# Patient Record
Sex: Male | Born: 1995 | Race: White | Hispanic: No | Marital: Single | Smoking: Never smoker
Health system: Southern US, Community
[De-identification: ages and names within clinical notes are randomized; demographics above are authoritative.]

## PROBLEM LIST (undated history)

## (undated) DIAGNOSIS — L709 Acne, unspecified: Secondary | ICD-10-CM

## (undated) HISTORY — PX: TYMPANOSTOMY TUBE PLACEMENT: SHX32

## (undated) HISTORY — DX: Acne, unspecified: L70.9

---
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
## 2015-05-21 DIAGNOSIS — L709 Acne, unspecified: Secondary | ICD-10-CM | POA: Insufficient documentation

## 2015-05-22 ENCOUNTER — Ambulatory Visit (INDEPENDENT_AMBULATORY_CARE_PROVIDER_SITE_OTHER): Payer: Managed Care, Other (non HMO) | Admitting: Unknown Physician Specialty

## 2015-05-22 ENCOUNTER — Encounter: Payer: Self-pay | Admitting: Unknown Physician Specialty

## 2015-05-22 VITALS — BP 133/86 | HR 85 | Temp 99.0°F | Ht 66.2 in | Wt 137.8 lb

## 2015-05-22 DIAGNOSIS — G44229 Chronic tension-type headache, not intractable: Secondary | ICD-10-CM | POA: Insufficient documentation

## 2015-05-22 MED ORDER — CYCLOBENZAPRINE HCL 10 MG PO TABS: 10.0000 mg | ORAL_TABLET | Freq: Every day | ORAL | Status: DC

## 2015-05-22 NOTE — Progress Notes (Signed)
BP 133/86 mmHg  Pulse 85  Temp(Src) 99 F (37.2 C)  Ht 5' 6.2" (1.681 m)  Wt 137 lb 12.8 oz (62.506 kg)  BMI 22.12 kg/m2  SpO2 98%   Subjective:    Patient ID: Willie Gilbert, male    DOB: 02/13/1995, 20 y.o.   MRN: [id number]  HPI: Willie Gilbert is a 20 y.o. male  Chief Complaint  Patient presents with  . Migraine    pt states he has been having severe headaches since January. He states he has been taking ibuprofen and tylenol PM. He also states his neck has been hurting before the headache starts.   Headaches Pt states he is getting frequent severe headaches.  It always starts at the back of his head and moves to temples and the pain is a pressure pain.  Sometimes nauseated but not typical.  States they start at random times of the day.  He doesn't usually wake up with them and often happen at the end of his shift and last through the night.  He is taking a lot of Ibuprofen and occasional Tylenol PM.  States the headaches are most days.    He works as a IT sales professional at [employer].  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  Denies any new stress.    Relevant past medical, surgical, family and social history reviewed and updated as indicated. Interim medical history since our last visit reviewed. Allergies and medications reviewed and updated.  Review of Systems  Constitutional: Negative.   Eyes: Negative.   Respiratory: Negative.   Cardiovascular: Negative.   Gastrointestinal: Negative.   Endocrine: Negative.   Genitourinary: Negative.   Skin: Negative.   Allergic/Immunologic: Negative.   Neurological: Positive for headaches.  Hematological: Negative.   Psychiatric/Behavioral: Negative.     Per HPI unless specifically indicated above     Objective:    BP 133/86 mmHg  Pulse 85  Temp(Src) 99 F (37.2 C)  Ht 5' 6.2" (1.681 m)  Wt 137 lb 12.8 oz (62.506 kg)  BMI 22.12 kg/m2  SpO2 98%  Wt Readings from Last 3 Encounters:   05/22/15 137 lb 12.8 oz (62.506 kg) (24 %*, Z = -0.72)  04/03/14 127 lb (57.607 kg) (14 %*, Z = -1.09)   * Growth percentiles are based on CDC 2-20 Years data.    Physical Exam  Constitutional: He is oriented to person, place, and time. He appears well-developed and well-nourished. No distress.  HENT:  Head: Normocephalic and atraumatic.  Eyes: Conjunctivae and lids are normal. Right eye exhibits no discharge. Left eye exhibits no discharge. No scleral icterus.  Neck: Normal range of motion. Spinous process tenderness and muscular tenderness present.  Cardiovascular: Normal rate.   Pulmonary/Chest: Effort normal.  Abdominal: Normal appearance. There is no splenomegaly or hepatomegaly.  Musculoskeletal: Normal range of motion.  Neurological: He is alert and oriented to person, place, and time.  Skin: Skin is intact. No rash noted. No pallor.  Psychiatric: He has a normal mood and affect. His behavior is normal. Judgment and thought content normal.    No results found for this or any previous visit.    Assessment & Plan:   Problem List Items Addressed This Visit      Unprioritized   Chronic tension headaches - Primary    Flexeril QHS prn.  Aleve in the AM before headache starts.  Appt with Dr. Laural Benes for manipulation to help  with neck pain.  Discussed yoga videos.        Relevant Medications   cyclobenzaprine (FLEXERIL) 10 MG tablet       Follow up plan: Return for appt with Dr. Laural BenesJohnson for body work.

## 2015-05-22 NOTE — Assessment & Plan Note (Signed)
Flexeril QHS prn.  Aleve in the AM before headache starts.  Appt with Dr. Laural BenesJohnson for manipulation to help with neck pain.  Discussed yoga videos.

## 2015-05-22 NOTE — Patient Instructions (Signed)
Tension Headache A tension headache is a feeling of pain, pressure, or aching that is often felt over the front and sides of the head. The pain can be dull, or it can feel tight (constricting). Tension headaches are not normally associated with nausea or vomiting, and they do not get worse with physical activity. Tension headaches can last from 30 minutes to several days. This is the most common type of headache. CAUSES The exact cause of this condition is not known. Tension headaches often begin after stress, anxiety, or depression. Other triggers may include:  Alcohol.  Too much caffeine, or caffeine withdrawal.  Respiratory infections, such as colds, flu, or sinus infections.  Dental problems or teeth clenching.  Fatigue.  Holding your head and neck in the same position for a long period of time, such as while using a computer.  Smoking. SYMPTOMS Symptoms of this condition include:  A feeling of pressure around the head.  Dull, aching head pain.  Pain felt over the front and sides of the head.  Tenderness in the muscles of the head, neck, and shoulders. DIAGNOSIS This condition may be diagnosed based on your symptoms and a physical exam. Tests may be done, such as a CT scan or an MRI of your head. These tests may be done if your symptoms are severe or unusual. TREATMENT This condition may be treated with lifestyle changes and medicines to help relieve symptoms. HOME CARE INSTRUCTIONS Managing Pain  Take over-the-counter and prescription medicines only as told by your health care provider.  Lie down in a dark, quiet room when you have a headache.  If directed, apply ice to the head and neck area:  Put ice in a plastic bag.  Place a towel between your skin and the bag.  Leave the ice on for 20 minutes, 2-3 times per day.  Use a heating pad or a hot shower to apply heat to the head and neck area as told by your health care provider. Eating and Drinking  Eat meals on  a regular schedule.  Limit alcohol use.  Decrease your caffeine intake, or stop using caffeine. General Instructions  Keep all follow-up visits as told by your health care provider. This is important.  Keep a headache journal to help find out what may trigger your headaches. For example, write down:  What you eat and drink.  How much sleep you get.  Any change to your diet or medicines.  Try massage or other relaxation techniques.  Limit stress.  Sit up straight, and avoid tensing your muscles.  Do not use tobacco products, including cigarettes, chewing tobacco, or e-cigarettes. If you need help quitting, ask your health care provider.  Exercise regularly as told by your health care provider.  Get 7-9 hours of sleep, or the amount recommended by your health care provider. SEEK MEDICAL CARE IF:  Your symptoms are not helped by medicine.  You have a headache that is different from what you normally experience.  You have nausea or you vomit.  You have a fever. SEEK IMMEDIATE MEDICAL CARE IF:  Your headache becomes severe.  You have repeated vomiting.  You have a stiff neck.  You have a loss of vision.  You have problems with speech.  You have pain in your eye or ear.  You have muscular weakness or loss of muscle control.  You lose your balance or you have trouble walking.  You feel faint or you pass out.  You have confusion.     This information is not intended to replace advice given to you by your health care provider. Make sure you discuss any questions you have with your health care provider.   Document Released: 01/20/2005 Document Revised: 10/11/2014 Document Reviewed: 05/15/2014 Elsevier Interactive Patient Education 2016 [employer]  

## 2015-05-24 ENCOUNTER — Encounter: Payer: Self-pay | Admitting: Family Medicine

## 2015-05-24 ENCOUNTER — Ambulatory Visit (INDEPENDENT_AMBULATORY_CARE_PROVIDER_SITE_OTHER): Payer: Managed Care, Other (non HMO) | Admitting: Family Medicine

## 2015-05-24 VITALS — BP 119/76 | HR 70 | Temp 98.7°F | Ht 66.4 in | Wt 136.0 lb

## 2015-05-24 DIAGNOSIS — G44229 Chronic tension-type headache, not intractable: Secondary | ICD-10-CM

## 2015-05-24 DIAGNOSIS — Z113 Encounter for screening for infections with a predominantly sexual mode of transmission: Secondary | ICD-10-CM

## 2015-05-24 NOTE — Progress Notes (Signed)
BP 119/76 mmHg  Pulse 70  Temp(Src) 98.7 F (37.1 C)  Ht 5' 6.4" (1.687 m)  Wt 136 lb (61.689 kg)  BMI 21.68 kg/m2  SpO2 100%   Subjective:    Patient ID: Willie Gilbert, male    DOB: 02/18/1995, 20 y.o.   MRN: [id number]  HPI: Willie Gilbert is a 20 y.o. male  Chief Complaint  Patient presents with  . Neck Pain    OMM consult    TENSION HEADACHES Duration: January Onset: gradual Severity: severe Quality: pressure-like, pulling and shooting Frequency: intermittent Location: neck up to his temples Headache duration: hours, better with ibuprofen Radiation: yes Time of day headache occurs: afternoon Alleviating factors: tylenol/ibuprofen Aggravating factors: Work, Field seismologist Headache status at time of visit: asymptomatic Treatments attempted: Treatments attempted: rest, ice, heat, APAP, ibuprofen and aleve", excedrine   Aura: no Nausea:  no Vomiting: no Photophobia:  no Phonophobia:  no Effect on social functioning:  no Confusion:  no Gait disturbance/ataxia:  no Behavioral changes:  no Fevers:  no  STD SCREENING Sexual activity:  Practices careful partner selection, always uses condoms Recent unprotected intercourse: no History of sexually transmitted diseases: no Previous sexually transmitted disease screening: no Genital lesions: no Penile discharge: no Dysuria: no Swollen lymph nodes: no Fevers: no Rash: no  Relevant past medical, surgical, family and social history reviewed and updated as indicated. Interim medical history since our last visit reviewed. Allergies and medications reviewed and updated.  Review of Systems  Constitutional: Negative.   Respiratory: Negative.   Cardiovascular: Negative.   Musculoskeletal: Positive for myalgias, neck pain and neck stiffness. Negative for back pain, joint swelling, arthralgias and gait problem.  Skin: Negative.   Psychiatric/Behavioral: Negative.     Per HPI unless specifically indicated above     Objective:    BP 119/76 mmHg  Pulse 70  Temp(Src) 98.7 F (37.1 C)  Ht 5' 6.4" (1.687 m)  Wt 136 lb (61.689 kg)  BMI 21.68 kg/m2  SpO2 100%  Wt Readings from Last 3 Encounters:  05/24/15 136 lb (61.689 kg) (21 %*, Z = -0.81)  05/22/15 137 lb 12.8 oz (62.506 kg) (24 %*, Z = -0.72)  04/03/14 127 lb (57.607 kg) (14 %*, Z = -1.09)   * Growth percentiles are based on CDC 2-20 Years data.    Physical Exam  Constitutional: He is oriented to person, place, and time. He appears well-developed and well-nourished. No distress.  HENT:  Head: Normocephalic and atraumatic.  Right Ear: Hearing normal.  Left Ear: Hearing normal.  Nose: Nose normal.  Eyes: Conjunctivae and lids are normal. Right eye exhibits no discharge. Left eye exhibits no discharge. No scleral icterus.  Pulmonary/Chest: Effort normal. No respiratory distress.  Neurological: He is alert and oriented to person, place, and time.  Skin: Skin is warm, dry and intact. No rash noted. No erythema. No pallor.  Psychiatric: He has a normal mood and affect. His speech is normal and behavior is normal. Judgment and thought content normal. Cognition and memory are normal.  Neck Exam:    Tenderness to Palpation: yes    Midline cervical spine: no    Paraspinal neck musculature: no    Trapezius: yes    Sternocleidomastoid: yes     Range of Motion:     Flexion: Normal    Extension: Normal    Lateral rotation: Normal    Lateral bending: Normal     Neuro Examination: Upper extremity DTRs normal & symmetric.  Strength and sensation intact.     Special Tests:     Spurling test: negative   No results found for this or any previous visit.    Assessment & Plan:   Problem List Items Addressed This Visit      Nervous and Auditory   Chronic tension headaches - Primary    Hypertonic traps and R SCM. Stretches given today. Will return ASAP for OMT. Flexeril at night. Ibuprofen as needed. Continue to monitor.        Other Visit  Diagnoses    Screening for STD (sexually transmitted disease)        No concerns, but would like to be screened. Labs drawn today.    Relevant Orders    HIV antibody    GC/Chlamydia Probe Amp    Hepatitis, Acute    HSV(herpes simplex vrs) 1+2 ab-IgG    RPR        Follow up plan: Return ASAP , for OMT.

## 2015-05-24 NOTE — Assessment & Plan Note (Signed)
Hypertonic traps and R SCM. Stretches given today. Will return ASAP for OMT. Flexeril at night. Ibuprofen as needed. Continue to monitor.

## 2015-05-25 LAB — HEPATITIS PANEL, ACUTE
HEP B S AG: NEGATIVE
Hep A IgM: NEGATIVE
Hep B C IgM: NEGATIVE
Hep C Virus Ab: <0.1 s/co ratio (ref 0.0–0.9)

## 2015-05-25 LAB — HIV ANTIBODY (ROUTINE TESTING W REFLEX): HIV Screen 4th Generation wRfx: NONREACTIVE

## 2015-05-25 LAB — HSV(HERPES SIMPLEX VRS) I + II AB-IGG: HSV 2 Glycoprotein G Ab, IgG: <0.91 index (ref 0.00–0.90)

## 2015-05-25 LAB — RPR: RPR Ser Ql: NONREACTIVE

## 2015-05-26 LAB — GC/CHLAMYDIA PROBE AMP
Chlamydia trachomatis, NAA: NEGATIVE
Neisseria gonorrhoeae by PCR: NEGATIVE

## 2015-05-28 ENCOUNTER — Telehealth: Payer: Self-pay | Admitting: Family Medicine

## 2015-05-28 NOTE — Telephone Encounter (Signed)
Please let him know that all his tests came back negative- nice and normal

## 2015-05-28 NOTE — Telephone Encounter (Signed)
Left message that labs were normal

## 2015-05-30 ENCOUNTER — Ambulatory Visit (INDEPENDENT_AMBULATORY_CARE_PROVIDER_SITE_OTHER): Payer: Managed Care, Other (non HMO) | Admitting: Family Medicine

## 2015-05-30 ENCOUNTER — Encounter: Payer: Self-pay | Admitting: Family Medicine

## 2015-05-30 VITALS — BP 121/79 | HR 62 | Temp 98.4°F | Ht 66.4 in | Wt 138.0 lb

## 2015-05-30 DIAGNOSIS — M9902 Segmental and somatic dysfunction of thoracic region: Secondary | ICD-10-CM

## 2015-05-30 DIAGNOSIS — M9908 Segmental and somatic dysfunction of rib cage: Secondary | ICD-10-CM | POA: Diagnosis not present

## 2015-05-30 DIAGNOSIS — M9900 Segmental and somatic dysfunction of head region: Secondary | ICD-10-CM

## 2015-05-30 DIAGNOSIS — M9901 Segmental and somatic dysfunction of cervical region: Secondary | ICD-10-CM | POA: Diagnosis not present

## 2015-05-30 DIAGNOSIS — G44229 Chronic tension-type headache, not intractable: Secondary | ICD-10-CM

## 2015-05-30 NOTE — Progress Notes (Signed)
BP 121/79 mmHg  Pulse 62  Temp(Src) 98.4 F (36.9 C)  Ht 5' 6.4" (1.687 m)  Wt 138 lb (62.596 kg)  BMI 21.99 kg/m2  SpO2 100%   Subjective:    Patient ID: Willie Gilbert, male    DOB: [birth year], 20 y.o.   MRN: [id number]  HPI: Willie Gilbert is a 20 y.o. male  Chief Complaint  Patient presents with  . OMM    Neck and shoulder pain   Willie Gilbert states that he is doing better with the stretches and his medication. He has even started yoga! He notes that yesterday he had a really bad headache and could feel how it was coming from his neck right into his R eye. He still couldn't break it though. It was severe and throbbing. Better with stretching most of the time, worse with work and at the end of the day. No other signs or symptoms. He is otherwise feeling well with no other concerns or complaints at this time.   Relevant past medical, surgical, family and social history reviewed and updated as indicated. Interim medical history since our last visit reviewed. Allergies and medications reviewed and updated.  Review of Systems  Constitutional: Negative.   Respiratory: Negative.   Cardiovascular: Negative.   Musculoskeletal: Positive for myalgias, neck pain and neck stiffness. Negative for back pain, joint swelling, arthralgias and gait problem.  Psychiatric/Behavioral: Negative.     Per HPI unless specifically indicated above     Objective:    BP 121/79 mmHg  Pulse 62  Temp(Src) 98.4 F (36.9 C)  Ht 5' 6.4" (1.687 m)  Wt 138 lb (62.596 kg)  BMI 21.99 kg/m2  SpO2 100%  Wt Readings from Last 3 Encounters:  05/30/15 138 lb (62.596 kg) (24 %*, Z = -0.71)  05/24/15 136 lb (61.689 kg) (21 %*, Z = -0.81)  05/22/15 137 lb 12.8 oz (62.506 kg) (24 %*, Z = -0.72)   * Growth percentiles are based on CDC 2-20 Years data.    Physical Exam  Constitutional: He is oriented to person, place, and time. He appears well-developed and well-nourished. No distress.  HENT:  Head:  Normocephalic and atraumatic.  Right Ear: Hearing normal.  Left Ear: Hearing normal.  Nose: Nose normal.  Eyes: Conjunctivae and lids are normal. Right eye exhibits no discharge. Left eye exhibits no discharge. No scleral icterus.  Pulmonary/Chest: Effort normal. No respiratory distress.  Neurological: He is alert and oriented to person, place, and time.  Skin: Skin is warm and intact. No rash noted. No erythema. No pallor.  Psychiatric: He has a normal mood and affect. His speech is normal and behavior is normal. Judgment and thought content normal. Cognition and memory are normal.  Nursing note and vitals reviewed. Musculoskeletal:  Exam found Decreased ROM, Tissue texture changes, Tenderness to palpation and Asymmetry of patient's  head, neck, thorax and ribs Osteopathic Structural Exam:   Head: Hypertonic suboccipital muscles  Neck: Trap spasm on the R, SCM spasm on the R  Thorax: T3-5SLRR  Ribs: Ribs 6-8 locked up on the R, Rib 7 locked up on the L  Results for orders placed or performed in visit on 05/24/15  GC/Chlamydia Probe Amp  Result Value Ref Range   Chlamydia trachomatis, NAA Negative Negative   Neisseria gonorrhoeae by PCR Negative Negative  HIV antibody  Result Value Ref Range   HIV Screen 4th Generation wRfx Non Reactive Non Reactive  Hepatitis, Acute  Result Value Ref Range  Hep A IgM Negative Negative   Hepatitis B Surface Ag Negative Negative   Hep B C IgM Negative Negative   Hep C Virus Ab <0.1 0.0 - 0.9 s/co ratio  HSV(herpes simplex vrs) 1+2 ab-IgG  Result Value Ref Range   HSV 1 Glycoprotein G Ab, IgG <0.91 0.00 - 0.90 index   HSV 2 Glycoprotein G Ab, IgG <0.91 0.00 - 0.90 index  RPR  Result Value Ref Range   RPR Ser Ql Non Reactive Non Reactive      Assessment & Plan:   Problem List Items Addressed This Visit      Nervous and Auditory   Chronic tension headaches - Primary    Doing better with stretches. He does have some somatic dysfunction that  I think is contributing to his symptoms. I think he would benefit from OMT. Patient treated today as discussed below. Call with any concerns. Follow up in 2 weeks if needed.        Other Visit Diagnoses    Cranial somatic dysfunction        Cervical segment dysfunction        Thoracic region somatic dysfunction        Rib cage region somatic dysfunction          After verbal consent was obtained, patient was treated today with osteopathic manipulative medicine to the regions of the head, neck, thorax and ribs using the techniques of myofascial release, counterstrain, muscle energy, HVLA and soft tissue. Areas of compensation relating to his primary pain source also treated. Patient tolerated the procedure well with good objective and good subjective improvement in symptoms. He left the room in good condition. He was advised to stay well hydrated and that he may have some soreness following the procedure. If not improving or worsening, he will call and come in. Home exercise program of stretches for neck, traps and SCM discussed and demonstrated today. Patient will do these stretches BID to before the point of pain, and will return for reevaluation  in 1-2 weeks.   Follow up plan: Return in about 2 weeks (around 06/13/2015).

## 2015-05-30 NOTE — Assessment & Plan Note (Signed)
Doing better with stretches. He does have some somatic dysfunction that I think is contributing to his symptoms. I think he would benefit from OMT. Patient treated today as discussed below. Call with any concerns. Follow up in 2 weeks if needed.

## 2015-06-13 ENCOUNTER — Ambulatory Visit (INDEPENDENT_AMBULATORY_CARE_PROVIDER_SITE_OTHER): Payer: Managed Care, Other (non HMO) | Admitting: Family Medicine

## 2015-06-13 ENCOUNTER — Encounter: Payer: Self-pay | Admitting: Family Medicine

## 2015-06-13 VITALS — BP 116/58 | HR 57 | Temp 98.7°F | Ht 66.6 in | Wt 137.0 lb

## 2015-06-13 DIAGNOSIS — M9901 Segmental and somatic dysfunction of cervical region: Secondary | ICD-10-CM | POA: Diagnosis not present

## 2015-06-13 DIAGNOSIS — M9902 Segmental and somatic dysfunction of thoracic region: Secondary | ICD-10-CM

## 2015-06-13 DIAGNOSIS — G44229 Chronic tension-type headache, not intractable: Secondary | ICD-10-CM

## 2015-06-13 DIAGNOSIS — M9900 Segmental and somatic dysfunction of head region: Secondary | ICD-10-CM | POA: Diagnosis not present

## 2015-06-13 DIAGNOSIS — M9907 Segmental and somatic dysfunction of upper extremity: Secondary | ICD-10-CM | POA: Diagnosis not present

## 2015-06-13 DIAGNOSIS — M9908 Segmental and somatic dysfunction of rib cage: Secondary | ICD-10-CM

## 2015-06-13 NOTE — Progress Notes (Signed)
BP 116/58 mmHg  Pulse 57  Temp(Src) 98.7 F (37.1 C)  Ht 5' 6.6" (1.692 m)  Wt 137 lb (62.143 kg)  BMI 21.71 kg/m2  SpO2 98%   Subjective:    Patient ID: Willie Gilbert, male    DOB: 1995/04/04, [age] y.o.   MRN: [id number]  HPI: Willie Gilbert is a 20 y.o. male  Chief Complaint  Patient presents with  . OMM   [name] presents today for evaluation of chronic tension headaches. He states that he is doing much better. He has been stretching his neck and is no longer having the issues with headaches at work. He does note that he had 1 bad neck ache, for which he took a muscle relaxer and it went away. He is otherwise doing well. He notes that his pain is at the back of his neck, mainly on the R side with some radiation into his head and shoulder. It's better with stretching and OMT and worse with work. It's not mild and pulling in nature. He is otherwise doing well with no other concerns or complaints at this time.   Relevant past medical, surgical, family and social history reviewed and updated as indicated. Interim medical history since our last visit reviewed. Allergies and medications reviewed and updated.  Review of Systems  Constitutional: Negative.   HENT: Negative.   Respiratory: Negative.   Musculoskeletal: Positive for myalgias, neck pain and neck stiffness. Negative for back pain, joint swelling, arthralgias and gait problem.  Psychiatric/Behavioral: Negative.     Per HPI unless specifically indicated above     Objective:    BP 116/58 mmHg  Pulse 57  Temp(Src) 98.7 F (37.1 C)  Ht 5' 6.6" (1.692 m)  Wt 137 lb (62.143 kg)  BMI 21.71 kg/m2  SpO2 98%  Wt Readings from Last 3 Encounters:  06/13/15 137 lb (62.143 kg) (22 %*, Z = -0.77)  05/30/15 138 lb (62.596 kg) (24 %*, Z = -0.71)  05/24/15 136 lb (61.689 kg) (21 %*, Z = -0.81)   * Growth percentiles are based on CDC 2-20 Years data.    Physical Exam  Constitutional: He is oriented to person, place, and time.  He appears well-developed and well-nourished. No distress.  HENT:  Head: Normocephalic and atraumatic.  Right Ear: Hearing normal.  Left Ear: Hearing normal.  Nose: Nose normal.  Eyes: Conjunctivae and lids are normal. Right eye exhibits no discharge. Left eye exhibits no discharge. No scleral icterus.  Pulmonary/Chest: Effort normal. No respiratory distress.  Musculoskeletal: He exhibits tenderness. He exhibits no edema.  Neurological: He is alert and oriented to person, place, and time.  Skin: Skin is warm, dry and intact. No rash noted. No erythema. No pallor.  Psychiatric: He has a normal mood and affect. His speech is normal and behavior is normal. Judgment and thought content normal. Cognition and memory are normal.  Musculoskeletal:  Exam found Decreased ROM, Tissue texture changes, Tenderness to palpation and Asymmetry of patient's  head, neck, thorax, ribs and upper extremity Osteopathic Structural Exam:   Head: OAESSR, OM restricted on the R  Neck: SCM hypertonic bilaterally, C3ESRR, trap spasm on the R  Thorax: trap spasm on the R, T1-3SLRR  Ribs: Rib 1 locked up on the R, Rib 6 locked up on the R, Rib 5 locked up on the L  Upper Extremity: clavicle restricted on the R   Results for orders placed or performed in visit on 05/24/15  GC/Chlamydia Probe Amp  Result  Value Ref Range   Chlamydia trachomatis, NAA Negative Negative   Neisseria gonorrhoeae by PCR Negative Negative  HIV antibody  Result Value Ref Range   HIV Screen 4th Generation wRfx Non Reactive Non Reactive  Hepatitis, Acute  Result Value Ref Range   Hep A IgM Negative Negative   Hepatitis B Surface Ag Negative Negative   Hep B C IgM Negative Negative   Hep C Virus Ab <0.1 0.0 - 0.9 s/co ratio  HSV(herpes simplex vrs) 1+2 ab-IgG  Result Value Ref Range   HSV 1 Glycoprotein G Ab, IgG <0.91 0.00 - 0.90 index   HSV 2 Glycoprotein G Ab, IgG <0.91 0.00 - 0.90 index  RPR  Result Value Ref Range   RPR Ser Ql Non  Reactive Non Reactive      Assessment & Plan:   Problem List Items Addressed This Visit      Nervous and Auditory   Chronic tension headaches - Primary    Doing better with stretches. He does have some somatic dysfunction that I think is contributing to his symptoms. I think he would benefit from OMT. Patient treated today as discussed below. Call with any concerns. Follow up on a PRN basis.       Other Visit Diagnoses    Cranial somatic dysfunction        Cervical segment dysfunction        Thoracic region somatic dysfunction        Rib cage region somatic dysfunction        Upper extremity somatic dysfunction          After verbal consent was obtained, patient was treated today with osteopathic manipulative medicine to the regions of the head, neck, thorax, ribs and upper extremity using the techniques of myofascial release, counterstrain, muscle energy, HVLA and soft tissue. Areas of compensation relating to his primary pain source also treated. Patient tolerated the procedure well with good objective and good subjective improvement in symptoms. He left the room in good condition. He was advised to stay well hydrated and that he may have some soreness following the procedure. If not improving or worsening, he will call and come in. Home exercise program of stretches for SCM discussed and demonstrated today. Patient will do these stretches BID to before the point of pain, and will return for reevaluation  on a PRN basis.   Follow up plan: Return if symptoms worsen or fail to improve.

## 2015-06-13 NOTE — Assessment & Plan Note (Signed)
Doing better with stretches. He does have some somatic dysfunction that I think is contributing to his symptoms. I think he would benefit from OMT. Patient treated today as discussed below. Call with any concerns. Follow up on a PRN basis.

## 2015-08-13 ENCOUNTER — Encounter: Payer: Self-pay | Admitting: Unknown Physician Specialty

## 2015-08-13 ENCOUNTER — Ambulatory Visit (INDEPENDENT_AMBULATORY_CARE_PROVIDER_SITE_OTHER): Payer: Managed Care, Other (non HMO) | Admitting: Unknown Physician Specialty

## 2015-08-13 VITALS — BP 134/60 | HR 60 | Temp 98.2°F | Ht 66.5 in | Wt 142.5 lb

## 2015-08-13 DIAGNOSIS — F418 Other specified anxiety disorders: Secondary | ICD-10-CM | POA: Insufficient documentation

## 2015-08-13 DIAGNOSIS — F419 Anxiety disorder, unspecified: Secondary | ICD-10-CM

## 2015-08-13 MED ORDER — PROPRANOLOL HCL 10 MG PO TABS: ORAL_TABLET | ORAL | Status: DC

## 2015-08-13 NOTE — Assessment & Plan Note (Signed)
Start with Inderal 1 hour before performance.

## 2015-08-13 NOTE — Progress Notes (Signed)
BP 134/60 mmHg  Pulse 60  Temp(Src) 98.2 F (36.8 C)  Ht 5' 6.5" (1.689 m)  Wt 142 lb 8 oz (64.638 kg)  BMI 22.66 kg/m2  SpO2 100%   Subjective:    Patient ID: Willie Gilbert, male    DOB: 11/28/1995, 20 y.o.   MRN: [id number]  HPI: Willie Gilbert is a 19 y.o. male  Chief Complaint  Patient presents with  . Anxiety    pt states he has been having trouble with "performance anxiety". 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   Anxiety Some depression but feels it is more related to anxiety.  Getting out of high school and figuring out where he fits in. 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    Relevant past medical, surgical, family and social history reviewed and updated as indicated. Interim medical history since our last visit reviewed. Allergies and medications reviewed and updated.  Review of Systems  Per HPI unless specifically indicated above     Objective:    BP 134/60 mmHg  Pulse 60  Temp(Src) 98.2 F (36.8 C)  Ht 5' 6.5" (1.689 m)  Wt 142 lb 8 oz (64.638 kg)  BMI 22.66 kg/m2  SpO2 100%  Wt Readings from Last 3 Encounters:  08/13/15 142 lb 8 oz (64.638 kg) (30 %*, Z = -0.52)  06/13/15 137 lb (62.143 kg) (22 %*, Z = -0.77)  05/30/15 138 lb (62.596 kg) (24 %*, Z = -0.71)   * Growth percentiles are based on CDC 2-20 Years data.    Physical Exam  Constitutional: He is oriented to person, place, and time. He appears well-developed and well-nourished. No distress.  HENT:  Head: Normocephalic and atraumatic.  Eyes: Conjunctivae and lids are normal. Right eye exhibits no discharge. Left eye exhibits no discharge. No scleral icterus.  Neck: Normal range of motion. Neck supple. No JVD present. Carotid bruit is not present.  Cardiovascular: Normal rate, regular rhythm and normal heart sounds.   Pulmonary/Chest: Effort normal and breath sounds normal. No respiratory distress.  Abdominal: Normal appearance.  There is no splenomegaly or hepatomegaly.  Musculoskeletal: Normal range of motion.  Neurological: He is alert and oriented to person, place, and time.  Skin: Skin is warm, dry and intact. No rash noted. No pallor.  Psychiatric: He has a normal mood and affect. His behavior is normal. Judgment and thought content normal.    Results for orders placed or performed in visit on 05/24/15  GC/Chlamydia Probe Amp  Result Value Ref Range   Chlamydia trachomatis, NAA Negative Negative   Neisseria gonorrhoeae by PCR Negative Negative  HIV antibody  Result Value Ref Range   HIV Screen 4th Generation wRfx Non Reactive Non Reactive  Hepatitis, Acute  Result Value Ref Range   Hep A IgM Negative Negative   Hepatitis B Surface Ag Negative Negative   Hep B C IgM Negative Negative   Hep C Virus Ab <0.1 0.0 - 0.9 s/co ratio  HSV(herpes simplex vrs) 1+2 ab-IgG  Result Value Ref Range   HSV 1 Glycoprotein G Ab, IgG <0.91 0.00 - 0.90 index   HSV 2 Glycoprotein G Ab, IgG <0.91 0.00 - 0.90 index  RPR  Result Value Ref Range   RPR Ser Ql Non Reactive Non Reactive      Assessment & Plan:   Problem List Items Addressed This Visit      Unprioritized   Performance anxiety -  Primary    Start with Inderal 1 hour before performance.            Follow up plan: Return in about 4 weeks (around 09/10/2015) for physical.

## 2015-09-04 ENCOUNTER — Encounter (INDEPENDENT_AMBULATORY_CARE_PROVIDER_SITE_OTHER): Payer: Self-pay

## 2015-09-25 ENCOUNTER — Encounter: Payer: Managed Care, Other (non HMO) | Admitting: Unknown Physician Specialty

## 2016-05-19 ENCOUNTER — Ambulatory Visit (INDEPENDENT_AMBULATORY_CARE_PROVIDER_SITE_OTHER): Payer: Commercial Managed Care - PPO | Admitting: Family Medicine

## 2016-05-19 ENCOUNTER — Encounter: Payer: Self-pay | Admitting: Family Medicine

## 2016-05-19 VITALS — BP 108/72 | HR 89 | Temp 99.0°F | Wt 144.5 lb

## 2016-05-19 DIAGNOSIS — M9902 Segmental and somatic dysfunction of thoracic region: Secondary | ICD-10-CM

## 2016-05-19 DIAGNOSIS — G44229 Chronic tension-type headache, not intractable: Secondary | ICD-10-CM | POA: Diagnosis not present

## 2016-05-19 DIAGNOSIS — M9908 Segmental and somatic dysfunction of rib cage: Secondary | ICD-10-CM

## 2016-05-19 DIAGNOSIS — M9901 Segmental and somatic dysfunction of cervical region: Secondary | ICD-10-CM | POA: Diagnosis not present

## 2016-05-19 DIAGNOSIS — F339 Major depressive disorder, recurrent, unspecified: Secondary | ICD-10-CM | POA: Insufficient documentation

## 2016-05-19 DIAGNOSIS — M9900 Segmental and somatic dysfunction of head region: Secondary | ICD-10-CM

## 2016-05-19 DIAGNOSIS — F321 Major depressive disorder, single episode, moderate: Secondary | ICD-10-CM | POA: Diagnosis not present

## 2016-05-19 MED ORDER — FLUOXETINE HCL 20 MG PO CAPS: 20.0000 mg | ORAL_CAPSULE | Freq: Every day | ORAL | 3 refills | Status: DC

## 2016-05-19 NOTE — Assessment & Plan Note (Signed)
Will start him on prozac. Recheck 1 month. Call with any concerns.

## 2016-05-19 NOTE — Assessment & Plan Note (Signed)
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 I think he would benefit from OMT. Treated today with good results as below. Continue to monitor.

## 2016-05-19 NOTE — Progress Notes (Signed)
BP 108/72   Pulse 89   Temp 99 F (37.2 C)   Wt 144 lb 8 oz (65.5 kg)   SpO2 100%   BMI 22.97 kg/m    Subjective:    Patient ID: Willie Gilbert, male    DOB: [birth year], 20 y.o.   MRN: [id number]  HPI: Willie Gilbert is a 21 y.o. male  Chief Complaint  Patient presents with  . Headache   DEPRESSION- has been going on in waves, mainly linked to his job, hasn't been able to work out because of time, doesn't feel like he has any energy Mood status: uncontrolled Satisfied with current treatment?: no Symptom severity: moderate  Duration of current treatment : Not on anything Psychotherapy/counseling: no  Previous psychiatric medications: None Depressed mood: yes Anxious mood: no Anhedonia: no Significant weight loss or gain: no Insomnia: no  Fatigue: yes Feelings of worthlessness or guilt: yes Impaired concentration/indecisiveness: yes Suicidal ideations: no Hopelessness: no Crying spells: yes Depression screen [facility] 2/9 05/19/2016 08/13/2015 06/13/2015  Decreased Interest 3 2 0  Down, Depressed, Hopeless 2 0 0  PHQ - 2 Score 5 2 0  Altered sleeping 3 2 -  Tired, decreased energy 3 2 -  Change in appetite 3 2 -  Feeling bad or failure about yourself  2 2 -  Trouble concentrating 3 0 -  Moving slowly or fidgety/restless 0 0 -  Suicidal thoughts 0 0 -  PHQ-9 Score 19 10 -   Head has not been doing well. Has been doing his stretches, but doesn't find that they are helping right. Now has a tension headaches right now. He has a really tight jaw and neck Jaw is hurting while he is playing the flute. He notes that OMT helped last time, but it's been almost a year since he's been seen. Pain is in his occiput and neck. Better with stretching and OMT, worse with stress and grinding his teeth. No radiation. He is otherwise feeling well with no other concerns or complaints at this time.   Relevant past medical, surgical, family and social history reviewed and updated as  indicated. Interim medical history since our last visit reviewed. Allergies and medications reviewed and updated.  Review of Systems  Constitutional: Negative.   Respiratory: Negative.   Cardiovascular: Negative.   Musculoskeletal: Positive for myalgias, neck pain and neck stiffness. Negative for arthralgias, back pain, gait problem and joint swelling.  Neurological: Negative.   Psychiatric/Behavioral: Positive for dysphoric mood and sleep disturbance. Negative for agitation, behavioral problems, confusion, decreased concentration, hallucinations, self-injury and suicidal ideas. The patient is nervous/anxious. The patient is not hyperactive.     Per HPI unless specifically indicated above     Objective:    BP 108/72   Pulse 89   Temp 99 F (37.2 C)   Wt 144 lb 8 oz (65.5 kg)   SpO2 100%   BMI 22.97 kg/m   Wt Readings from Last 3 Encounters:  05/19/16 144 lb 8 oz (65.5 kg)  08/13/15 142 lb 8 oz (64.6 kg) (30 %, Z= -0.52)*  06/13/15 137 lb (62.1 kg) (22 %, Z= -0.77)*   * Growth percentiles are based on CDC 2-20 Years data.    Physical Exam  Constitutional: He is oriented to person, place, and time. He appears well-developed and well-nourished. No distress.  HENT:  Head: Normocephalic and atraumatic.  Right Ear: Hearing normal.  Left Ear: Hearing normal.  Nose: Nose normal.  Eyes: Conjunctivae, EOM and lids  are normal. Pupils are equal, round, and reactive to light. Right eye exhibits no discharge. Left eye exhibits no discharge. No scleral icterus.  Cardiovascular: Normal rate, regular rhythm, normal heart sounds and intact distal pulses.  Exam reveals no gallop and no friction rub.   No murmur heard. Pulmonary/Chest: Effort normal and breath sounds normal. No respiratory distress. He has no wheezes. He has no rales. He exhibits no tenderness.  Musculoskeletal: Normal range of motion.  Neurological: He is alert and oriented to person, place, and time.  Skin: Skin is warm,  dry and intact. No rash noted. He is not diaphoretic. No erythema. No pallor.  Psychiatric: He has a normal mood and affect. His speech is normal and behavior is normal. Judgment and thought content normal. Cognition and memory are normal.  Nursing note and vitals reviewed. Musculoskeletal:  Exam found Decreased ROM, Tissue texture changes, Tenderness to palpation and Asymmetry of patient's  head, neck, thorax and ribs Osteopathic Structural Exam:   Head: lateral pterygoid spasm bilaterally, OAESSR, OM suture restricted on the R, hypertonic suboccipital muscles bilaterally R>L  Neck: C3-5 ESRR, C2 rotated L  Thorax: T2-4SLRR  Ribs: Ribs 5-8 locked up on the L, Ribs 2-4 locked up on the R   Results for orders placed or performed in visit on 05/24/15  GC/Chlamydia Probe Amp  Result Value Ref Range   Chlamydia trachomatis, NAA Negative Negative   Neisseria gonorrhoeae by PCR Negative Negative  HIV antibody  Result Value Ref Range   HIV Screen 4th Generation wRfx Non Reactive Non Reactive  Hepatitis, Acute  Result Value Ref Range   Hep A IgM Negative Negative   Hepatitis B Surface Ag Negative Negative   Hep B C IgM Negative Negative   Hep C Virus Ab <0.1 0.0 - 0.9 s/co ratio  HSV(herpes simplex vrs) 1+2 ab-IgG  Result Value Ref Range   HSV 1 Glycoprotein G Ab, IgG <0.91 0.00 - 0.90 index   HSV 2 Glycoprotein G Ab, IgG <0.91 0.00 - 0.90 index  RPR  Result Value Ref Range   RPR Ser Ql Non Reactive Non Reactive      Assessment & Plan:   Problem List Items Addressed This Visit      Nervous and Auditory   Chronic tension headaches - Primary    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 I think he would benefit from OMT. Treated today with good results as below. Continue to monitor.       Relevant Medications   FLUoxetine (PROZAC) 20 MG capsule     Other   Depression, major, single episode, moderate (HCC)    Will start him on prozac. Recheck 1 month. Call  with any concerns.       Relevant Medications   FLUoxetine (PROZAC) 20 MG capsule    Other Visit Diagnoses    Cranial somatic dysfunction       Cervical segment dysfunction       Thoracic region somatic dysfunction       Rib cage region somatic dysfunction         After verbal consent was obtained, patient was treated today with osteopathic manipulative medicine to the regions of the head, neck, thorax and ribs using the techniques of cranial, FPR, myofascial release, counterstrain, muscle energy, HVLA and soft tissue. Areas of compensation relating to his primary pain source also treated. Patient tolerated the procedure well with good objective and good subjective improvement in symptoms. He left  the room in good condition. He was advised to stay well hydrated and that he may have some soreness following the procedure. If not improving or worsening, he will call and come in. Home exercise program of stretches for lateral pterygoids discussed and demonstrated today. Patient will do these stretches BID to before the point of pain, and will return for reevaluation  In 3-4 weeks.   Follow up plan: Return in about 4 weeks (around 06/16/2016) for Mood and OMM eval.

## 2016-06-19 ENCOUNTER — Encounter: Payer: Self-pay | Admitting: Family Medicine

## 2016-06-19 ENCOUNTER — Ambulatory Visit (INDEPENDENT_AMBULATORY_CARE_PROVIDER_SITE_OTHER): Payer: Commercial Managed Care - PPO | Admitting: Family Medicine

## 2016-06-19 VITALS — BP 133/76 | HR 96 | Ht 67.0 in | Wt 144.0 lb

## 2016-06-19 DIAGNOSIS — M9901 Segmental and somatic dysfunction of cervical region: Secondary | ICD-10-CM

## 2016-06-19 DIAGNOSIS — F321 Major depressive disorder, single episode, moderate: Secondary | ICD-10-CM | POA: Diagnosis not present

## 2016-06-19 DIAGNOSIS — M9900 Segmental and somatic dysfunction of head region: Secondary | ICD-10-CM | POA: Diagnosis not present

## 2016-06-19 DIAGNOSIS — Z113 Encounter for screening for infections with a predominantly sexual mode of transmission: Secondary | ICD-10-CM | POA: Diagnosis not present

## 2016-06-19 DIAGNOSIS — G44229 Chronic tension-type headache, not intractable: Secondary | ICD-10-CM | POA: Diagnosis not present

## 2016-06-19 DIAGNOSIS — M9902 Segmental and somatic dysfunction of thoracic region: Secondary | ICD-10-CM | POA: Diagnosis not present

## 2016-06-19 MED ORDER — BUPROPION HCL ER (SR) 150 MG PO TB12: ORAL_TABLET | ORAL | 0 refills | Status: DC

## 2016-06-19 NOTE — Assessment & Plan Note (Signed)
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 I think he would benefit from OMT. Treated today with good results as below. Continue to monitor.

## 2016-06-19 NOTE — Progress Notes (Signed)
BP 133/76   Pulse 96   Ht 5\' 7"  (1.702 m)   Wt 144 lb (65.3 kg)   SpO2 99%   BMI 22.55 kg/m    Subjective:    Patient ID: Willie Gilbert, male    DOB: 05-19-1995, 20 y.o.   MRN: [id number]  HPI: Willie Gilbert is a 21 y.o. male  Chief Complaint  Patient presents with  . Follow-up  . Headache    About the same.    STD SCREENING Sexual activity:  Recent unprotected sexual encounter Contraception: no Recent unprotected intercourse: yes History of sexually transmitted diseases: no Previous sexually transmitted disease screening: yes Genital lesions: no Penile discharge: no Dysuria: no Swollen lymph nodes: no Fevers: no Rash: no  DEPRESSION- medicine was really weird at first. Had quite a bit of erectile dysfunction and feeling like a zombie Mood status: better Satisfied with current treatment?: no Symptom severity: mild  Duration of current treatment : Has not been on anything for about 2-3 weeks Side effects: yes Medication compliance: fair compliance Psychotherapy/counseling: no  Previous psychiatric medications: prozac Depressed mood: yes Anxious mood: yes Anhedonia: no Significant weight loss or gain: no Insomnia: no  Fatigue: no Feelings of worthlessness or guilt: yes Impaired concentration/indecisiveness: no Suicidal ideations: no Hopelessness: no Crying spells: no [facility] 06/19/2016 05/19/2016 08/13/2015 06/13/2015  Decreased Interest 2 3 2  0  Down, Depressed, Hopeless 1 2 0 0  PHQ - 2 Score 3 5 2  0  Altered sleeping 2 3 2  -  Tired, decreased energy 2 3 2  -  Change in appetite 2 3 2  -  Feeling bad or failure about yourself  0 2 2 -  Trouble concentrating 2 3 0 -  Moving slowly or fidgety/restless 0 0 0 -  Suicidal thoughts 0 0 0 -  PHQ-9 Score 11 19 10  -   Headaches have been coming in waves. Was having a swollen lymph node in his neck about 3 weeks ago. He got a foam roller and has been rolling his back out and has been  rolling his neck- He notes that it has been a bit better, but it's still acting up. It was better after his last treatment for about a week. No numbness or tingling. No radiation. Tight in nature. Better with stretching and OMT. Worse with stress. He is otherwise feeling well with no other concerns or complaints at this time.   Relevant past medical, surgical, family and social history reviewed and updated as indicated. Interim medical history since our last visit reviewed. Allergies and medications reviewed and updated.  Review of Systems  Constitutional: Negative.   Respiratory: Negative.   Cardiovascular: Negative.   Musculoskeletal: Positive for myalgias, neck pain and neck stiffness. Negative for arthralgias, back pain, gait problem and joint swelling.  Skin: Negative.   Neurological: Positive for headaches. Negative for dizziness, tremors, seizures, syncope, facial asymmetry, speech difficulty, weakness, light-headedness and numbness.  Psychiatric/Behavioral: Negative.     Per HPI unless specifically indicated above     Objective:    BP 133/76   Pulse 96   Ht 5\' 7"  (1.702 m)   Wt 144 lb (65.3 kg)   SpO2 99%   BMI 22.55 kg/m   Wt Readings from Last 3 Encounters:  06/19/16 144 lb (65.3 kg)  05/19/16 144 lb 8 oz (65.5 kg)  08/13/15 142 lb 8 oz (64.6 kg) (30 %, Z= -0.52)*   * Growth percentiles are based on CDC 2-20  Years data.    Physical Exam  Constitutional: He is oriented to person, place, and time. He appears well-developed and well-nourished. No distress.  HENT:  Head: Normocephalic and atraumatic.  Right Ear: Hearing normal.  Left Ear: Hearing normal.  Nose: Nose normal.  Eyes: Conjunctivae and lids are normal. Right eye exhibits no discharge. Left eye exhibits no discharge. No scleral icterus.  Cardiovascular: Normal rate, regular rhythm, normal heart sounds and intact distal pulses.  Exam reveals no gallop and no friction rub.   No murmur heard. Pulmonary/Chest:  Effort normal and breath sounds normal. No respiratory distress. He has no wheezes. He has no rales. He exhibits no tenderness.  Neurological: He is alert and oriented to person, place, and time. He has normal reflexes. He displays normal reflexes. No cranial nerve deficit. He exhibits normal muscle tone. Coordination normal.  Skin: Skin is warm, dry and intact. No rash noted. He is not diaphoretic. No erythema. No pallor.  Psychiatric: He has a normal mood and affect. His speech is normal and behavior is normal. Judgment and thought content normal. Cognition and memory are normal.  Nursing note and vitals reviewed. Musculoskeletal:  Exam found Decreased ROM, Tissue texture changes, Tenderness to palpation and Asymmetry of patient's  head, neck and thorax Osteopathic Structural Exam:   Head: hypertonic suboccipital muscles, OAESSL, OM hypertonic on the R  Neck: C4ESRR, C6ESRL, SCM hypertonic bilaterally  Thorax: T3-5SLRR, trap spasm on the R   Results for orders placed or performed in visit on 05/24/15  GC/Chlamydia Probe Amp  Result Value Ref Range   Chlamydia trachomatis, NAA Negative Negative   Neisseria gonorrhoeae by PCR Negative Negative  HIV antibody  Result Value Ref Range   HIV Screen 4th Generation wRfx Non Reactive Non Reactive  Hepatitis, Acute  Result Value Ref Range   Hep A IgM Negative Negative   Hepatitis B Surface Ag Negative Negative   Hep B C IgM Negative Negative   Hep C Virus Ab <0.1 0.0 - 0.9 s/co ratio  HSV(herpes simplex vrs) 1+2 ab-IgG  Result Value Ref Range   HSV 1 Glycoprotein G Ab, IgG <0.91 0.00 - 0.90 index   HSV 2 Glycoprotein G Ab, IgG <0.91 0.00 - 0.90 index  RPR  Result Value Ref Range   RPR Ser Ql Non Reactive Non Reactive      Assessment & Plan:   Problem List Items Addressed This Visit      Nervous and Auditory   Chronic tension headaches - Primary    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 I think he would benefit  from OMT. Treated today with good results as below. Continue to monitor.       Relevant Medications   buPROPion (WELLBUTRIN SR) 150 MG 12 hr tablet     Other   Depression, major, single episode, moderate (HCC)    Did not do well on prozac. Will start wellbutrin. Call with any concerns. Recheck 1 month.       Relevant Medications   buPROPion (WELLBUTRIN SR) 150 MG 12 hr tablet    Other Visit Diagnoses    Routine screening for STI (sexually transmitted infection)       Labs drawn today. Await results.    Relevant Orders   HIV antibody   HSV(herpes simplex vrs) 1+2 ab-IgG   GC/Chlamydia Probe Amp   RPR   Hepatitis, Acute   Cranial somatic dysfunction       Cervical segment dysfunction  Thoracic region somatic dysfunction         After verbal consent was obtained, patient was treated today with osteopathic manipulative medicine to the regions of the head, neck and thorax using the techniques of FPR, myofascial release, muscle energy, HVLA and soft tissue. Areas of compensation relating to his primary pain source also treated. Patient tolerated the procedure well with good objective and good subjective improvement in symptoms. He left the room in good condition. He was advised to stay well hydrated and that he may have some soreness following the procedure. If not improving or worsening, he will call and come in. He will return for reevaluation  In 3-4 weeks.   Follow up plan: Return in about 4 weeks (around 07/17/2016) for Physical.

## 2016-06-19 NOTE — Patient Instructions (Signed)
[facility]  [facility]  $34.65 $ 34 65

## 2016-06-19 NOTE — Assessment & Plan Note (Signed)
Did not do well on prozac. Will start wellbutrin. Call with any concerns. Recheck 1 month.

## 2016-06-20 ENCOUNTER — Telehealth: Payer: Self-pay | Admitting: Family Medicine

## 2016-06-20 LAB — HSV(HERPES SIMPLEX VRS) I + II AB-IGG
HSV 1 Glycoprotein G Ab, IgG: <0.91 index (ref 0.00–0.90)
HSV 2 Glycoprotein G Ab, IgG: <0.91 index (ref 0.00–0.90)

## 2016-06-20 LAB — HEPATITIS PANEL, ACUTE
HEP A IGM: NEGATIVE
HEP B S AG: NEGATIVE
Hep B C IgM: NEGATIVE

## 2016-06-20 LAB — RPR: RPR: NONREACTIVE

## 2016-06-20 LAB — HIV ANTIBODY (ROUTINE TESTING W REFLEX): HIV Screen 4th Generation wRfx: NONREACTIVE

## 2016-06-20 NOTE — Telephone Encounter (Signed)
Patient notified

## 2016-06-20 NOTE — Telephone Encounter (Signed)
Please let him know that his labs came back normal- we're just waiting on the GC/Chlamydia and will let him know that on Monday.

## 2016-06-21 LAB — GC/CHLAMYDIA PROBE AMP
Chlamydia trachomatis, NAA: NEGATIVE
Neisseria gonorrhoeae by PCR: NEGATIVE

## 2016-06-23 ENCOUNTER — Telehealth: Payer: Self-pay | Admitting: Family Medicine

## 2016-06-23 NOTE — Telephone Encounter (Signed)
Called to let patient know that his tests came back negative.

## 2016-07-25 ENCOUNTER — Encounter: Payer: Commercial Managed Care - PPO | Admitting: Family Medicine

## 2016-09-04 ENCOUNTER — Other Ambulatory Visit: Payer: Self-pay | Admitting: Family Medicine

## 2016-09-04 ENCOUNTER — Telehealth: Payer: Self-pay | Admitting: Family Medicine

## 2016-09-04 MED ORDER — BUPROPION HCL ER (SR) 150 MG PO TB12: 150.0000 mg | ORAL_TABLET | Freq: Two times a day (BID) | ORAL | 1 refills | Status: DC

## 2016-09-04 NOTE — Telephone Encounter (Signed)
Patient needs refill sent in for his bupropion, he only has 2 pills left and no refills.  He said he did not realize it.  Thanks  [facility]    [place] [phone]

## 2016-09-04 NOTE — Telephone Encounter (Signed)
Routing to provider  

## 2016-09-04 NOTE — Telephone Encounter (Signed)
Rx sent to his pharmacy for 6 months.

## 2016-09-04 NOTE — Telephone Encounter (Signed)
Patient notified

## 2017-01-01 ENCOUNTER — Encounter: Payer: Self-pay | Admitting: Family Medicine

## 2017-01-01 ENCOUNTER — Ambulatory Visit (INDEPENDENT_AMBULATORY_CARE_PROVIDER_SITE_OTHER): Payer: Commercial Managed Care - PPO | Admitting: Family Medicine

## 2017-01-01 ENCOUNTER — Other Ambulatory Visit: Payer: Self-pay | Admitting: Family Medicine

## 2017-01-01 VITALS — BP 132/83 | HR 75 | Wt 150.0 lb

## 2017-01-01 DIAGNOSIS — N390 Urinary tract infection, site not specified: Secondary | ICD-10-CM | POA: Diagnosis not present

## 2017-01-01 MED ORDER — CIPROFLOXACIN HCL 500 MG PO TABS: 500.0000 mg | ORAL_TABLET | Freq: Two times a day (BID) | ORAL | 0 refills | Status: DC

## 2017-01-01 MED ORDER — TAMSULOSIN HCL 0.4 MG PO CAPS: 0.4000 mg | ORAL_CAPSULE | Freq: Every day | ORAL | 1 refills | Status: DC

## 2017-01-01 MED ORDER — BUPROPION HCL ER (SR) 150 MG PO TB12: 150.0000 mg | ORAL_TABLET | Freq: Two times a day (BID) | ORAL | 1 refills | Status: DC

## 2017-01-01 NOTE — Progress Notes (Signed)
   BP 132/83   Pulse 75   Wt 150 lb (68 kg)   SpO2 97%   BMI 23.49 kg/m    Subjective:    Patient ID: Willie Gilbert, male    DOB: [birth year], 20 y.o.   MRN: [id number]  HPI: Willie Gilbert is a 21 y.o. male  Chief Complaint  Patient presents with  . Hematuria   Patient presents with about a week of right mild achy flank pain. Started this morning with dysuria and hematuria as well. Denies fevers, chills, N/V, exposure to STIs. States pain is not sharp or severe. Has not tried anything OTC for sxs. Never had a UTI or kidney stones in the past.   Relevant past medical, surgical, family and social history reviewed and updated as indicated. Interim medical history since our last visit reviewed. Allergies and medications reviewed and updated.  Review of Systems  Constitutional: Negative.   HENT: Negative.   Eyes: Negative.   Respiratory: Negative.   Cardiovascular: Negative.   Gastrointestinal: Negative.   Genitourinary: Positive for dysuria, flank pain and hematuria.  Neurological: Negative.   Psychiatric/Behavioral: Negative.    Per HPI unless specifically indicated above     Objective:    BP 132/83   Pulse 75   Wt 150 lb (68 kg)   SpO2 97%   BMI 23.49 kg/m   Wt Readings from Last 3 Encounters:  01/01/17 150 lb (68 kg)  06/19/16 144 lb (65.3 kg)  05/19/16 144 lb 8 oz (65.5 kg)    Physical Exam  Constitutional: He is oriented to person, place, and time. He appears well-developed and well-nourished. No distress.  HENT:  Head: Atraumatic.  Eyes: Conjunctivae are normal. Pupils are equal, round, and reactive to light.  Neck: Normal range of motion. Neck supple.  Cardiovascular: Normal rate and normal heart sounds.  Pulmonary/Chest: Effort normal. No respiratory distress.  Abdominal: Soft. Bowel sounds are normal. He exhibits no distension. There is no tenderness. There is no guarding.  Musculoskeletal: Normal range of motion. He exhibits tenderness (mild ttp  over right flank).  Neurological: He is alert and oriented to person, place, and time.  Skin: Skin is warm and dry.  Psychiatric: He has a normal mood and affect. His behavior is normal.  Nursing note and vitals reviewed.  Results for orders placed or performed in visit on 01/01/17  GC/Chlamydia Probe Amp  Result Value Ref Range   Chlamydia trachomatis, NAA Negative Negative   Neisseria gonorrhoeae by PCR Negative Negative      Assessment & Plan:   Problem List Items Addressed This Visit    None    Visit Diagnoses    Acute lower UTI    -  Primary   Leuks present in urine as well as lysed RBCs. Will start abx as well as flomax to cover for both UTI and possible stones. Push fluids, GC Chlamydia pending   Relevant Orders   UA/M w/rflx Culture, Routine   GC/Chlamydia Probe Amp (Completed)       Follow up plan: Return if symptoms worsen or fail to improve.

## 2017-01-02 LAB — GC/CHLAMYDIA PROBE AMP
Chlamydia trachomatis, NAA: NEGATIVE
NEISSERIA GONORRHOEAE BY PCR: NEGATIVE

## 2017-01-02 NOTE — Patient Instructions (Signed)
Follow up as needed

## 2017-01-03 LAB — URINE CULTURE

## 2017-01-03 LAB — MICROSCOPIC EXAMINATION

## 2017-01-03 LAB — UA/M W/RFLX CULTURE, ROUTINE
Bilirubin, UA: NEGATIVE
Glucose, UA: NEGATIVE
Ketones, UA: NEGATIVE
Leukocytes, UA: NEGATIVE
NITRITE UA: NEGATIVE
PH UA: 5.5 (ref 5.0–7.5)
Specific Gravity, UA: >1.03 — ABNORMAL HIGH (ref 1.005–1.030)
UUROB: 0.2 mg/dL (ref 0.2–1.0)

## 2017-01-05 LAB — MICROSCOPIC EXAMINATION

## 2017-01-05 LAB — UA/M W/RFLX CULTURE, ROUTINE
Bilirubin, UA: NEGATIVE
GLUCOSE, UA: NEGATIVE
Ketones, UA: NEGATIVE
LEUKOCYTES UA: NEGATIVE
NITRITE UA: NEGATIVE
Specific Gravity, UA: >1.03 — ABNORMAL HIGH (ref 1.005–1.030)
UUROB: 0.2 mg/dL (ref 0.2–1.0)
pH, UA: 5.5 (ref 5.0–7.5)

## 2017-01-05 LAB — URINE CULTURE

## 2017-03-11 ENCOUNTER — Encounter: Payer: Self-pay | Admitting: Family Medicine

## 2017-03-13 ENCOUNTER — Ambulatory Visit: Payer: Commercial Managed Care - PPO | Admitting: Family Medicine

## 2017-03-13 ENCOUNTER — Ambulatory Visit
Admission: RE | Admit: 2017-03-13 | Discharge: 2017-03-13 | Disposition: A | Discharge status: Home / Self Care | Payer: Commercial Managed Care - PPO | Source: Ambulatory Visit | Attending: Family Medicine | Admitting: Family Medicine

## 2017-03-13 ENCOUNTER — Encounter: Payer: Self-pay | Admitting: Family Medicine

## 2017-03-13 VITALS — BP 104/64 | HR 85 | Temp 99.2°F | Wt 144.2 lb

## 2017-03-13 DIAGNOSIS — Z113 Encounter for screening for infections with a predominantly sexual mode of transmission: Secondary | ICD-10-CM

## 2017-03-13 DIAGNOSIS — R319 Hematuria, unspecified: Secondary | ICD-10-CM | POA: Diagnosis not present

## 2017-03-13 DIAGNOSIS — R300 Dysuria: Secondary | ICD-10-CM | POA: Diagnosis not present

## 2017-03-13 LAB — UA/M W/RFLX CULTURE, ROUTINE
Bilirubin, UA: NEGATIVE
GLUCOSE, UA: NEGATIVE
KETONES UA: NEGATIVE
LEUKOCYTES UA: NEGATIVE
NITRITE UA: NEGATIVE
PROTEIN UA: NEGATIVE
RBC UA: NEGATIVE
SPEC GRAV UA: 1.015 (ref 1.005–1.030)
Urobilinogen, Ur: 0.2 mg/dL (ref 0.2–1.0)
pH, UA: 7 (ref 5.0–7.5)

## 2017-03-13 MED ORDER — TAMSULOSIN HCL 0.4 MG PO CAPS: 0.4000 mg | ORAL_CAPSULE | Freq: Every day | ORAL | 1 refills | Status: DC

## 2017-03-13 NOTE — Progress Notes (Signed)
BP 104/64 (BP Location: Left Arm, Cuff Size: Normal)   Pulse 85   Temp 99.2 F (37.3 C)   Wt 144 lb 4 oz (65.4 kg)   SpO2 100%   BMI 22.59 kg/m    Subjective:    Patient ID: Willie Gilbert, male    DOB: Aug 05, 1995, 22 y.o.   MRN: [id number]  HPI: Willie Gilbert is a 22 y.o. male  Chief Complaint  Patient presents with  . SEXUALLY TRANSMITTED DISEASE  . Urinary Tract Infection   STD SCREENING Sexual activity:  Recent unprotected sexual encounter Contraception: no Recent unprotected intercourse: yes History of sexually transmitted diseases: no Previous sexually transmitted disease screening: yes Genital lesions: no Penile discharge: yes Dysuria: yes Swollen lymph nodes: no Fevers: no Rash: no  URINARY SYMPTOMS- has been nervous since last time he was here. Has been feeling pressure when he has to pee. His stream spreads out Duration: November- went away after treated, then came back about 1.5 weeks ago Dysuria: yes, pressure when he's peeing and itching at the very end Urinary frequency: yes Urgency: yes Small volume voids: yes Symptom severity: mild Urinary incontinence: no Foul odor: yes Hematuria: yes- small amounts after he pees- about 3 days ago Abdominal pain: yes Back pain: yes Suprapubic pain/pressure: no Flank pain: yes Fever:  no Vomiting: no Relief with cranberry juice: no Relief with pyridium: no Status: stable Previous urinary tract infection: yes Recurrent urinary tract infection: no Sexual activity: practicing safe sex History of sexually transmitted disease: no Penile discharge: yes Treatments attempted: increasing fluids   Relevant past medical, surgical, family and social history reviewed and updated as indicated. Interim medical history since our last visit reviewed. Allergies and medications reviewed and updated.  Review of Systems  Constitutional: Negative.   Respiratory: Negative.   Cardiovascular: Negative.     Genitourinary: Positive for discharge, dysuria, frequency, hematuria, penile pain and urgency. Negative for decreased urine volume, difficulty urinating, enuresis, flank pain, genital sores, penile swelling, scrotal swelling and testicular pain.  Neurological: Negative.   Psychiatric/Behavioral: Negative.     Per HPI unless specifically indicated above     Objective:    BP 104/64 (BP Location: Left Arm, Cuff Size: Normal)   Pulse 85   Temp 99.2 F (37.3 C)   Wt 144 lb 4 oz (65.4 kg)   SpO2 100%   BMI 22.59 kg/m   Wt Readings from Last 3 Encounters:  03/13/17 144 lb 4 oz (65.4 kg)  01/01/17 150 lb (68 kg)  06/19/16 144 lb (65.3 kg)    Physical Exam  Constitutional: He is oriented to person, place, and time. He appears well-developed and well-nourished. No distress.  HENT:  Head: Normocephalic and atraumatic.  Right Ear: Hearing normal.  Left Ear: Hearing normal.  Nose: Nose normal.  Eyes: Conjunctivae and lids are normal. Right eye exhibits no discharge. Left eye exhibits no discharge. No scleral icterus.  Cardiovascular: Normal rate, regular rhythm, normal heart sounds and intact distal pulses. Exam reveals no gallop and no friction rub.  No murmur heard. Pulmonary/Chest: Effort normal and breath sounds normal. No respiratory distress. He has no wheezes. He has no rales. He exhibits no tenderness.  Musculoskeletal: Normal range of motion.  Neurological: He is alert and oriented to person, place, and time.  Skin: Skin is warm and intact. No rash noted. He is not diaphoretic. No erythema.  Psychiatric: He has a normal mood and affect. His speech is normal and behavior is normal.  Judgment and thought content normal. Cognition and memory are normal.  Nursing note and vitals reviewed.   Results for orders placed or performed in visit on 01/01/17  Microscopic Examination  Result Value Ref Range   WBC, UA 6-10 (A) 0 - 5 /hpf   RBC, UA 3-10 (A) 0 - 2 /hpf   Epithelial Cells  (non renal) 0-10 0 - 10 /hpf   Mucus, UA Present Not Estab.   Bacteria, UA Few None seen/Few  Urine Culture  Result Value Ref Range   Urine Culture, Routine Final report (A)    Organism ID, Bacteria Comment (A)   UA/M w/rflx Culture, Routine  Result Value Ref Range   Specific Gravity, UA >1.030 (H) 1.005 - 1.030   pH, UA 5.5 5.0 - 7.5   Color, UA Yellow Yellow   Appearance Ur Cloudy (A) Clear   Leukocytes, UA Negative Negative   Protein, UA 2+ (A) Negative/Trace   Glucose, UA Negative Negative   Ketones, UA Negative Negative   RBC, UA Trace (A) Negative   Bilirubin, UA Negative Negative   Urobilinogen, Ur 0.2 0.2 - 1.0 mg/dL   Nitrite, UA Negative Negative   Microscopic Examination See below:    Urinalysis Reflex Comment       Assessment & Plan:   Problem List Items Addressed This Visit    None    Visit Diagnoses    Dysuria    -  Primary   UA negative. Concern about kidney stone. Will check KUB and restart flomax and increase fluids. Checking GC/Chlamydia today.   Relevant Orders   UA/M w/rflx Culture, Routine   Screening for STD (sexually transmitted disease)       Labs drawn today. Await results.    Relevant Orders   HIV antibody   Hepatitis, Acute   RPR   GC/Chlamydia Probe Amp   HSV(herpes simplex vrs) 1+2 ab-IgG   Hematuria, unspecified type       Concern for kidney stone. Increase fluids. Start flomax. Check KUB. Call with any concerns.    Relevant Orders   DG Abd 1 View       Follow up plan: Return in about 3 months (around 06/10/2017) for Physical.

## 2017-03-14 LAB — HIV ANTIBODY (ROUTINE TESTING W REFLEX): HIV SCREEN 4TH GENERATION: NONREACTIVE

## 2017-03-14 LAB — HEPATITIS PANEL, ACUTE
Hep A IgM: NEGATIVE
Hep B C IgM: NEGATIVE
Hep C Virus Ab: <0.1 s/co ratio (ref 0.0–0.9)
Hepatitis B Surface Ag: NEGATIVE

## 2017-03-14 LAB — RPR: RPR Ser Ql: NONREACTIVE

## 2017-03-14 LAB — HSV(HERPES SIMPLEX VRS) I + II AB-IGG

## 2017-03-16 ENCOUNTER — Telehealth: Payer: Self-pay | Admitting: Family Medicine

## 2017-03-16 NOTE — Telephone Encounter (Addendum)
Please let him know that his STI testing came back normal- still waiting on his GC/Chlamydia, but everything else is good so far. He also does not have any kidney stones on the x-ray

## 2017-03-16 NOTE — Telephone Encounter (Signed)
Tried calling patient, LVM for patient to return call.

## 2017-03-17 ENCOUNTER — Telehealth: Payer: Self-pay | Admitting: Family Medicine

## 2017-03-17 LAB — GC/CHLAMYDIA PROBE AMP
CHLAMYDIA, DNA PROBE: NEGATIVE
Neisseria gonorrhoeae by PCR: NEGATIVE

## 2017-03-17 NOTE — Telephone Encounter (Signed)
Patient notified

## 2017-03-17 NOTE — Telephone Encounter (Signed)
Please let him know that all his labs came back normal including GC/chlamydia

## 2017-03-17 NOTE — Telephone Encounter (Signed)
Please call patient back @ [phone]

## 2017-05-28 ENCOUNTER — Encounter: Payer: Self-pay | Admitting: Family Medicine

## 2017-05-28 ENCOUNTER — Ambulatory Visit (INDEPENDENT_AMBULATORY_CARE_PROVIDER_SITE_OTHER): Payer: Commercial Managed Care - PPO | Admitting: Family Medicine

## 2017-05-28 VITALS — BP 119/73 | HR 84 | Temp 99.1°F | Wt 147.5 lb

## 2017-05-28 DIAGNOSIS — R41840 Attention and concentration deficit: Secondary | ICD-10-CM

## 2017-05-28 DIAGNOSIS — Z833 Family history of diabetes mellitus: Secondary | ICD-10-CM

## 2017-05-28 DIAGNOSIS — S39012A Strain of muscle, fascia and tendon of lower back, initial encounter: Secondary | ICD-10-CM

## 2017-05-28 DIAGNOSIS — Z0000 Encounter for general adult medical examination without abnormal findings: Secondary | ICD-10-CM

## 2017-05-28 LAB — BAYER DCA HB A1C WAIVED: HB A1C: 4.9 % (ref <7.0)

## 2017-05-28 MED ORDER — NAPROXEN 500 MG PO TABS: 500.0000 mg | ORAL_TABLET | Freq: Two times a day (BID) | ORAL | 3 refills | Status: DC

## 2017-05-28 NOTE — Progress Notes (Signed)
BP 119/73 (BP Location: Left Arm, Patient Position: Sitting, Cuff Size: Normal)   Pulse 84   Temp 99.1 F (37.3 C)   Wt 147 lb 8 oz (66.9 kg)   SpO2 99%   BMI 23.10 kg/m    Subjective:    Patient ID: Willie Gilbert, male    DOB: [birth year], 22 y.o.   MRN: [id number]  HPI: Willie Gilbert is a 22 y.o. male  Chief Complaint  Patient presents with  . difficulity concentrating  . Blood Sugar Problem    patient states that he has a family history of diabetes and would like to have his sugars checked  . Back Pain   Has a family history of diabetes- is concerned about this. Would like to be checked.  Has been having trouble concentrating. Has been having some issues with losing his train of thought. He works full time, but finds that it's been hard to connect with people and  Transferring to [employer]- has been working on getting everything squared away. He has been a bit stressed recently, but things are starting to calm down  Back has been hurting again like when he had a kidney stone. He was having pain in his low back and it would not radiate into his groin but into his side a little. No problems with his urine. Has cut out soda and has been feeling better since then.   Cut soda out because he was afraid of getting kidney stones. Interested in eating more healthily. Otherwise doing well with no other concerns or complaints at this time.   Relevant past medical, surgical, family and social history reviewed and updated as indicated. Interim medical history since our last visit reviewed. Allergies and medications reviewed and updated.  Review of Systems  Constitutional: Negative.   Respiratory: Negative.   Cardiovascular: Negative.   Musculoskeletal: Positive for back pain and myalgias. Negative for arthralgias, gait problem, joint swelling, neck pain and neck stiffness.  Skin: Negative.   Neurological: Negative.   Psychiatric/Behavioral: Positive for decreased  concentration. Negative for agitation, behavioral problems, confusion, dysphoric mood, hallucinations, self-injury, sleep disturbance and suicidal ideas. The patient is not nervous/anxious and is not hyperactive.     Per HPI unless specifically indicated above     Objective:    BP 119/73 (BP Location: Left Arm, Patient Position: Sitting, Cuff Size: Normal)   Pulse 84   Temp 99.1 F (37.3 C)   Wt 147 lb 8 oz (66.9 kg)   SpO2 99%   BMI 23.10 kg/m   Wt Readings from Last 3 Encounters:  05/28/17 147 lb 8 oz (66.9 kg)  03/13/17 144 lb 4 oz (65.4 kg)  01/01/17 150 lb (68 kg)    Physical Exam  Constitutional: He is oriented to person, place, and time. He appears well-developed and well-nourished. No distress.  HENT:  Head: Normocephalic and atraumatic.  Right Ear: Hearing normal.  Left Ear: Hearing normal.  Nose: Nose normal.  Eyes: Conjunctivae and lids are normal. Right eye exhibits no discharge. Left eye exhibits no discharge. No scleral icterus.  Cardiovascular: Normal rate, regular rhythm, normal heart sounds and intact distal pulses. Exam reveals no gallop and no friction rub.  No murmur heard. Pulmonary/Chest: Effort normal and breath sounds normal. No stridor. No respiratory distress. He has no wheezes. He has no rales. He exhibits no tenderness.  Abdominal: Soft. Bowel sounds are normal. He exhibits no distension and no mass. There is no tenderness. There is no  rebound and no guarding. No hernia.  Musculoskeletal: Normal range of motion. He exhibits no edema, tenderness or deformity.  Neurological: He is alert and oriented to person, place, and time.  Skin: Skin is warm, dry and intact. Capillary refill takes less than 2 seconds. No rash noted. He is not diaphoretic. No erythema. No pallor.  Psychiatric: He has a normal mood and affect. His speech is normal and behavior is normal. Judgment and thought content normal. Cognition and memory are normal.  Nursing note and vitals  reviewed.   Results for orders placed or performed in visit on 05/28/17  Bayer DCA Hb A1c Waived  Result Value Ref Range   Bayer DCA Hb A1c Waived 4.9 <7.0 %      Assessment & Plan:   Problem List Items Addressed This Visit    None    Visit Diagnoses    Family history of diabetes mellitus (DM)    -  Primary   A1c 4.9. Reassured patient.    Relevant Orders   Bayer DCA Hb A1c Waived (Completed)   Strain of lumbar region, initial encounter       PRN naproxen and flexeril. Call if not getting better or getting worse.    Concentration deficit       Likely due to extra stress. Will continue to monitor. Call if getting worse.    Preventative health care       Discussed healthy eating and exercise. Call with any concerns. Continue to monitor.        Follow up plan: Return if symptoms worsen or fail to improve, for Physical.

## 2017-06-12 ENCOUNTER — Encounter: Payer: Self-pay | Admitting: Family Medicine

## 2018-02-19 ENCOUNTER — Telehealth: Payer: Self-pay | Admitting: Family Medicine

## 2018-02-19 MED ORDER — BUPROPION HCL ER (SR) 150 MG PO TB12: ORAL_TABLET | ORAL | 6 refills | Status: DC

## 2018-02-19 NOTE — Addendum Note (Signed)
Addended by: Dorcas Carrow on: 02/19/2018 02:55 PM   Modules accepted: Orders

## 2018-02-19 NOTE — Telephone Encounter (Signed)
Copied from CRM [id number]. Topic: General - Other >> Feb 19, 2018 11:56 AM Tamela Oddi wrote: Reason for CRM: Patient called to request the doctor to write a prescription for an anti-depressant drug that he use to take to help him focus.   He stated that he has been experiencing some issues and wanted to go back on this medication.  Please call and advise patient.  CB# [phone]

## 2018-02-19 NOTE — Telephone Encounter (Signed)
Patient states he is in [place] and cannot come for an appointment but is hoping you will send him the med in to the [facility] on [address] [place]  Thank you

## 2018-02-19 NOTE — Telephone Encounter (Signed)
Please have him come in for an appointment.

## 2018-12-19 IMAGING — CR DG ABDOMEN 1V
1 series · 2 of 2 positions shown · non-contrast
Comparison: None.

CLINICAL DATA: Hematuria for 2 or 3 months.

EXAM:
ABDOMEN - 1 VIEW

[Series 1: dg abd 1 view · 0.14mm/px · 2 of 2 slices shown]
[im 1/2]
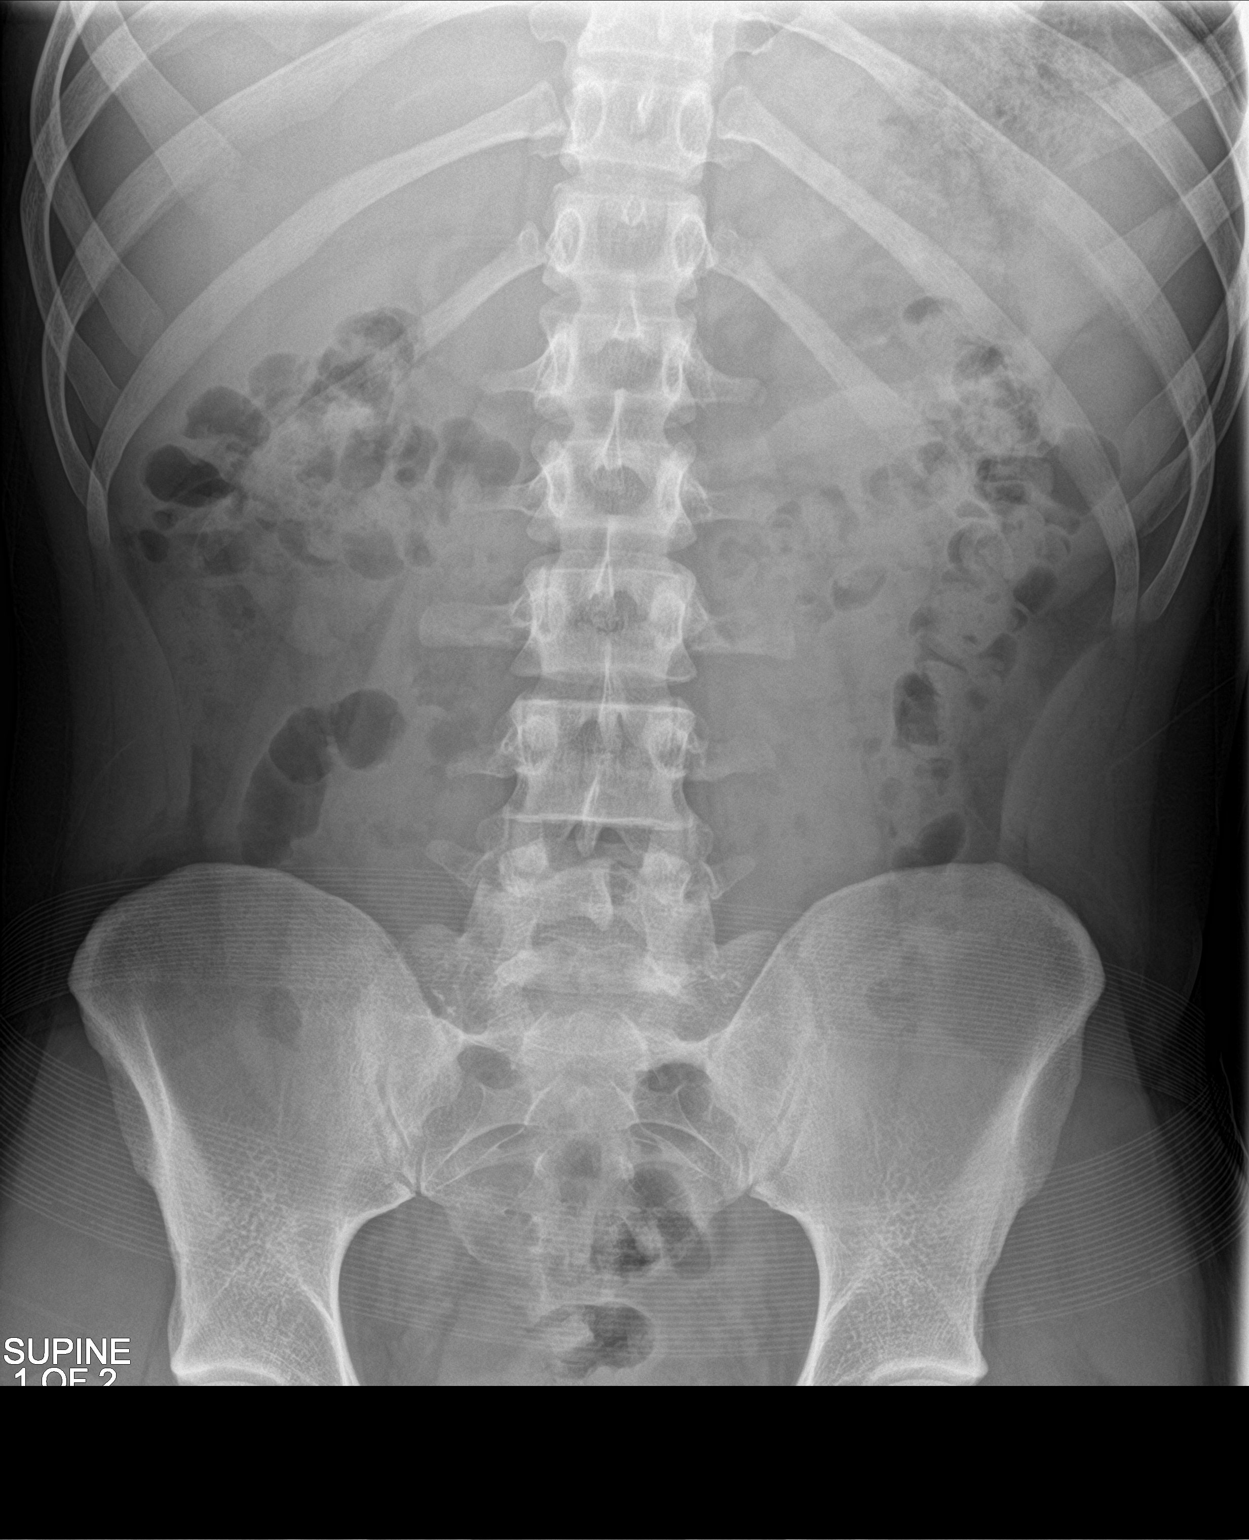
[im 2/2]
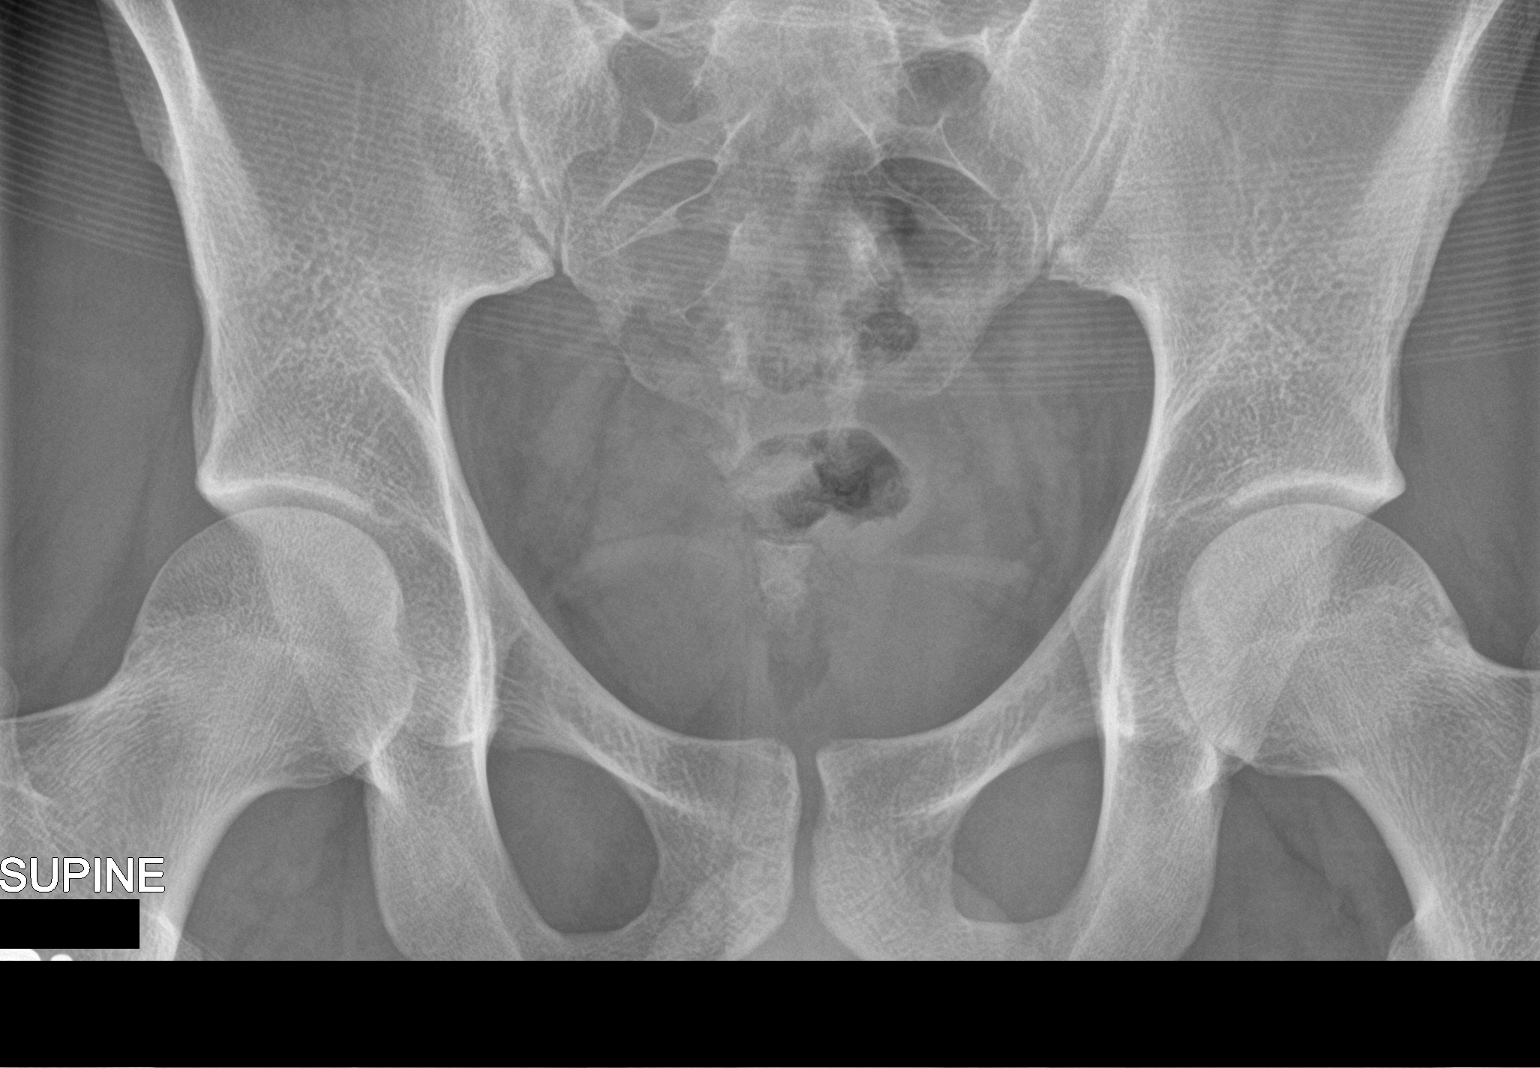

[2 of 2 positions shown; findings below may reference images not displayed]

FINDINGS: Both kidneys are partially obscured by bowel contents. No obvious
renal stones. No ureteral stones. The bowel gas pattern is normal.
IMPRESSION: No renal stones identified.

## 2019-11-21 ENCOUNTER — Encounter: Payer: Self-pay | Admitting: Family Medicine

## 2019-11-21 NOTE — Telephone Encounter (Signed)
Dr.Johnson does not have available spots until 11/29/19 and those are same day slots.

## 2019-11-28 ENCOUNTER — Encounter: Payer: Self-pay | Admitting: Family Medicine

## 2019-11-28 ENCOUNTER — Telehealth (INDEPENDENT_AMBULATORY_CARE_PROVIDER_SITE_OTHER): Payer: Managed Care, Other (non HMO) | Admitting: Family Medicine

## 2019-11-28 DIAGNOSIS — F331 Major depressive disorder, recurrent, moderate: Secondary | ICD-10-CM | POA: Diagnosis not present

## 2019-11-28 MED ORDER — BUPROPION HCL ER (SR) 150 MG PO TB12: ORAL_TABLET | ORAL | 6 refills | Status: DC

## 2019-11-28 MED ORDER — HYDROXYZINE PAMOATE 25 MG PO CAPS: 25.0000 mg | ORAL_CAPSULE | Freq: Three times a day (TID) | ORAL | 3 refills | Status: DC | PRN

## 2019-11-28 NOTE — Progress Notes (Signed)
Ht 5\' 7"  (1.702 m)   Wt 145 lb (65.8 kg)   BMI 22.71 kg/m    Subjective:    Patient ID: Willie Gilbert, male    DOB: Sep 18, 1995, 24 y.o.   MRN: [id number]  HPI: Willie Gilbert is a 24 y.o. male  Chief Complaint  Patient presents with  . mood    PHQ20/ GAD14 follow up - wants to be on wellbutrin, has been felling tired and depressed    DEPRESSION Mood status: exacerbated Satisfied with current treatment?: no Symptom severity: severe  Duration of current treatment : chronic Psychotherapy/counseling: yes in the past Previous psychiatric medications: wellbutrin Depressed mood: yes Anxious mood: yes Anhedonia: no Significant weight loss or gain: no Insomnia: yes  Fatigue: yes Feelings of worthlessness or guilt: yes Impaired concentration/indecisiveness: no Suicidal ideations: no Hopelessness: no Crying spells: yes Depression screen [facility] 2/9 11/28/2019 03/13/2017 06/19/2016 05/19/2016 08/13/2015  Decreased Interest 3 1 2 3 2   Down, Depressed, Hopeless 3 0 1 2 0  PHQ - 2 Score 6 1 3 5 2   Altered sleeping 3 3 2 3 2   Tired, decreased energy 3 2 2 3 2   Change in appetite 3 2 2 3 2   Feeling bad or failure about yourself  3 0 0 2 2  Trouble concentrating 2 0 2 3 0  Moving slowly or fidgety/restless 0 0 0 0 0  Suicidal thoughts 0 0 0 0 0  PHQ-9 Score 20 8 11 19 10   Difficult doing work/chores Very difficult - - - -   GAD 7 : Generalized Anxiety Score 11/28/2019  Nervous, Anxious, on Edge 3  Control/stop worrying 3  Worry too much - different things 3  Trouble relaxing 2  Restless 0  Easily annoyed or irritable 0  Afraid - awful might happen 3  Total GAD 7 Score 14  Anxiety Difficulty Very difficult    Relevant past medical, surgical, family and social history reviewed and updated as indicated. Interim medical history since our last visit reviewed. Allergies and medications reviewed and updated.  Review of Systems  Constitutional: Negative.   Respiratory:  Negative.   Cardiovascular: Negative.   Gastrointestinal: Negative.   Musculoskeletal: Negative.   Skin: Negative.   Neurological: Negative.   Psychiatric/Behavioral: Positive for decreased concentration and dysphoric mood. Negative for agitation, behavioral problems, confusion, hallucinations, self-injury, sleep disturbance and suicidal ideas. The patient is nervous/anxious. The patient is not hyperactive.     Per HPI unless specifically indicated above     Objective:    Ht 5\' 7"  (1.702 m)   Wt 145 lb (65.8 kg)   BMI 22.71 kg/m   Wt Readings from Last 3 Encounters:  11/28/19 145 lb (65.8 kg)  05/28/17 147 lb 8 oz (66.9 kg)  03/13/17 144 lb 4 oz (65.4 kg)    Physical Exam Vitals and nursing note reviewed.  Constitutional:      General: He is not in acute distress.    Appearance: Normal appearance. He is not ill-appearing, toxic-appearing or diaphoretic.  HENT:     Head: Normocephalic and atraumatic.     Right Ear: External ear normal.     Left Ear: External ear normal.     Nose: Nose normal.     Mouth/Throat:     Mouth: Mucous membranes are moist.     Pharynx: Oropharynx is clear.  Eyes:     General: No scleral icterus.       Right eye: No discharge.  Left eye: No discharge.     Conjunctiva/sclera: Conjunctivae normal.     Pupils: Pupils are equal, round, and reactive to light.  Pulmonary:     Effort: Pulmonary effort is normal. No respiratory distress.     Comments: Speaking in full sentences Musculoskeletal:        General: Normal range of motion.     Cervical back: Normal range of motion.  Skin:    Coloration: Skin is not jaundiced or pale.     Findings: No bruising, erythema, lesion or rash.  Neurological:     Mental Status: He is alert and oriented to person, place, and time. Mental status is at baseline.  Psychiatric:        Mood and Affect: Mood normal.        Behavior: Behavior normal.        Thought Content: Thought content normal.         Judgment: Judgment normal.     Results for orders placed or performed in visit on 05/28/17  Bayer DCA Hb A1c Waived  Result Value Ref Range   HB A1C (BAYER DCA - WAIVED) 4.9 <7.0 %      Assessment & Plan:   Problem List Items Addressed This Visit      Other   Depression, major, recurrent (HCC)    Not doing well. Will go back on wellbutrin and start hydroxyzine. List of counselors provided today. Call with any concerns. Continue to monitor.       Relevant Medications   hydrOXYzine (VISTARIL) 25 MG capsule   buPROPion (WELLBUTRIN SR) 150 MG 12 hr tablet       Follow up plan: Return in about 4 weeks (around 12/26/2019).    . This visit was completed via MyChart due to the restrictions of the COVID-19 pandemic. All issues as above were discussed and addressed. Physical exam was done as above through visual confirmation on MyChart. If it was felt that the patient should be evaluated in the office, they were directed there. The patient verbally consented to this visit. . Location of the patient: home . Location of the provider: work . Those involved with this call:  . Provider: Olevia Perches, DO . CMA: Wilhemena Durie, CMA . Front Desk/Registration: Harriet Pho  . Time spent on call: 15 minutes with patient face to face via video conference. More than 50% of this time was spent in counseling and coordination of care. 23 minutes total spent in review of patient's record and preparation of their chart.

## 2019-11-28 NOTE — Assessment & Plan Note (Signed)
Not doing well. Will go back on wellbutrin and start hydroxyzine. List of counselors provided today. Call with any concerns. Continue to monitor.

## 2019-11-29 ENCOUNTER — Telehealth: Payer: Self-pay

## 2019-11-29 NOTE — Telephone Encounter (Signed)
-----   Message from Dorcas Carrow, [place] sent at 11/28/2019  2:54 PM EDT ----- 2-4 weeks

## 2019-11-29 NOTE — Telephone Encounter (Signed)
lvm to make this apt.  

## 2019-12-02 NOTE — Telephone Encounter (Signed)
lvm to make this aptlvm to make this apt

## 2019-12-02 NOTE — Telephone Encounter (Signed)
lvm to make this aptlvm to make this apt 

## 2020-06-01 ENCOUNTER — Encounter: Payer: Self-pay | Admitting: Family Medicine

## 2020-06-01 ENCOUNTER — Telehealth (INDEPENDENT_AMBULATORY_CARE_PROVIDER_SITE_OTHER): Payer: Self-pay | Admitting: Family Medicine

## 2020-06-01 ENCOUNTER — Other Ambulatory Visit: Payer: Self-pay

## 2020-06-01 DIAGNOSIS — F331 Major depressive disorder, recurrent, moderate: Secondary | ICD-10-CM

## 2020-06-01 MED ORDER — TRETINOIN 0.05 % EX CREA: TOPICAL_CREAM | Freq: Every day | CUTANEOUS | 6 refills | Status: DC

## 2020-06-01 MED ORDER — BUPROPION HCL ER (SR) 200 MG PO TB12: 200.0000 mg | ORAL_TABLET | Freq: Two times a day (BID) | ORAL | 3 refills | Status: DC

## 2020-06-01 NOTE — Assessment & Plan Note (Signed)
Discussed neuropsych testing, psychiatry referral or increasing his wellbutrin. He would like to increase his wellbutrin and think about the others. Will recheck 1 month. Call with any concerns.

## 2020-06-01 NOTE — Progress Notes (Signed)
There were no vitals taken for this visit.   Subjective:    Patient ID: Willie Gilbert, male    DOB: Oct 28, 1995, 25 y.o.   MRN: [id number]  HPI: Willie Gilbert is a 25 y.o. male  Chief Complaint  Patient presents with  . Depression  . focus issues   DEPRESSION Mood status: uncontrolled Satisfied with current treatment?: no Symptom severity: moderate  Duration of current treatment : months Side effects: no Medication compliance: excellent compliance Psychotherapy/counseling: yes current Previous psychiatric medications: wellbutrin Depressed mood: yes Anxious mood: yes Anhedonia: no Significant weight loss or gain: no Insomnia: no  Fatigue: yes Feelings of worthlessness or guilt: no Impaired concentration/indecisiveness: yes Suicidal ideations: no Hopelessness: no Crying spells: no Depression screen [facility] 2/9 06/01/2020 11/28/2019 03/13/2017 06/19/2016 05/19/2016  Decreased Interest 3 3 1 2 3   Down, Depressed, Hopeless 2 3 0 1 2  PHQ - 2 Score 5 6 1 3 5   Altered sleeping 2 3 3 2 3   Tired, decreased energy 3 3 2 2 3   Change in appetite 0 3 2 2 3   Feeling bad or failure about yourself  3 3 0 0 2  Trouble concentrating 3 2 0 2 3  Moving slowly or fidgety/restless 0 0 0 0 0  Suicidal thoughts 0 0 0 0 0  PHQ-9 Score 16 20 8 11 19   Difficult doing work/chores Very difficult Very difficult - - -    Relevant past medical, surgical, family and social history reviewed and updated as indicated. Interim medical history since our last visit reviewed. Allergies and medications reviewed and updated.  Review of Systems  Constitutional: Negative.   Respiratory: Negative.   Cardiovascular: Negative.   Psychiatric/Behavioral: Positive for decreased concentration and dysphoric mood. Negative for agitation, behavioral problems, confusion, hallucinations, self-injury, sleep disturbance and suicidal ideas. The patient is nervous/anxious. The patient is not hyperactive.     Per HPI  unless specifically indicated above     Objective:    There were no vitals taken for this visit.  Wt Readings from Last 3 Encounters:  11/28/19 145 lb (65.8 kg)  05/28/17 147 lb 8 oz (66.9 kg)  03/13/17 144 lb 4 oz (65.4 kg)    Physical Exam Vitals and nursing note reviewed.  Constitutional:      General: He is not in acute distress.    Appearance: Normal appearance. He is not ill-appearing, toxic-appearing or diaphoretic.  HENT:     Head: Normocephalic and atraumatic.     Right Ear: External ear normal.     Left Ear: External ear normal.     Nose: Nose normal.     Mouth/Throat:     Mouth: Mucous membranes are moist.     Pharynx: Oropharynx is clear.  Eyes:     General: No scleral icterus.       Right eye: No discharge.        Left eye: No discharge.     Conjunctiva/sclera: Conjunctivae normal.     Pupils: Pupils are equal, round, and reactive to light.  Pulmonary:     Effort: Pulmonary effort is normal. No respiratory distress.     Comments: Speaking in full sentences Musculoskeletal:        General: Normal range of motion.     Cervical back: Normal range of motion.  Skin:    Coloration: Skin is not jaundiced or pale.     Findings: No bruising, erythema, lesion or rash.  Neurological:  Mental Status: He is alert and oriented to person, place, and time. Mental status is at baseline.  Psychiatric:        Mood and Affect: Mood normal.        Behavior: Behavior normal.        Thought Content: Thought content normal.        Judgment: Judgment normal.     Results for orders placed or performed in visit on 05/28/17  Bayer DCA Hb A1c Waived  Result Value Ref Range   HB A1C (BAYER DCA - WAIVED) 4.9 <7.0 %      Assessment & Plan:   Problem List Items Addressed This Visit      Other   Depression, major, recurrent (HCC) - Primary    Discussed neuropsych testing, psychiatry referral or increasing his wellbutrin. He would like to increase his wellbutrin and think  about the others. Will recheck 1 month. Call with any concerns.       Relevant Medications   buPROPion (WELLBUTRIN SR) 200 MG 12 hr tablet       Follow up plan: Return in about 4 weeks (around 06/29/2020) for virtual OK.   . This visit was completed via video visit through MyChart due to the restrictions of the COVID-19 pandemic. All issues as above were discussed and addressed. Physical exam was done as above through visual confirmation on video through MyChart. If it was felt that the patient should be evaluated in the office, they were directed there. The patient verbally consented to this visit. . Location of the patient: parking lot . Location of the provider: work . Those involved with this call:  . Provider: Olevia Perches, DO . CMA: Tiffany Reel, CMA . Front Desk/Registration: Harriet Pho  . Time spent on call: 15 minutes with patient face to face via video conference. More than 50% of this time was spent in counseling and coordination of care. 23 minutes total spent in review of patient's record and preparation of their chart.

## 2020-06-04 ENCOUNTER — Telehealth: Payer: Self-pay

## 2020-06-04 NOTE — Telephone Encounter (Signed)
Lvm to make this apt. 

## 2020-06-04 NOTE — Telephone Encounter (Signed)
-----   Message from Dorcas Carrow, DO sent at 06/01/2020  3:22 PM EDT ----- 4 weeks, virtual OK

## 2021-02-01 ENCOUNTER — Other Ambulatory Visit: Payer: Self-pay | Admitting: Family Medicine

## 2021-02-01 NOTE — Telephone Encounter (Signed)
Requested Prescriptions  Pending Prescriptions Disp Refills   buPROPion (WELLBUTRIN SR) 150 MG 12 hr tablet [Pharmacy Med Name: buPROPion HCl ER (SR) 150 MG Oral Tablet Extended Release 12 Hour] 60 tablet 0    Sig: TAKE 1 TABLET BY MOUTH IN THE MORNING FOR  1  WEEK  THEN  1  TABLET  TWICE  DAILY  AFTER  THAT     Psychiatry: Antidepressants - bupropion Failed - 02/01/2021  3:09 PM      Failed - Last BP in normal range    BP Readings from Last 1 Encounters:  05/28/17 119/73         Failed - Valid encounter within last 6 months    Recent Outpatient Visits          8 months ago Moderate episode of recurrent major depressive disorder (HCC)   [facility] [place], Megan P, DO   1 year ago Moderate episode of recurrent major depressive disorder (HCC)   [facility] Johnson, Megan P, DO   3 years ago Family history of diabetes mellitus (DM)   [facility] [place], [place], DO   3 years ago Dysuria   [facility], [place], DO   4 years ago Acute lower UTI   [facility], Salley Hews, [place]      Future Appointments            In 4 days Laural Benes, Oralia Rud, DO [employer], [facility]           Passed - Completed PHQ-2 or PHQ-9 in the last 360 days

## 2021-02-05 ENCOUNTER — Encounter: Payer: Self-pay | Admitting: Family Medicine

## 2021-02-05 ENCOUNTER — Telehealth (INDEPENDENT_AMBULATORY_CARE_PROVIDER_SITE_OTHER): Payer: Self-pay | Admitting: Family Medicine

## 2021-02-05 DIAGNOSIS — Z7251 High risk heterosexual behavior: Secondary | ICD-10-CM | POA: Insufficient documentation

## 2021-02-05 DIAGNOSIS — R41840 Attention and concentration deficit: Secondary | ICD-10-CM

## 2021-02-05 DIAGNOSIS — F331 Major depressive disorder, recurrent, moderate: Secondary | ICD-10-CM

## 2021-02-05 DIAGNOSIS — Z13220 Encounter for screening for lipoid disorders: Secondary | ICD-10-CM

## 2021-02-05 MED ORDER — VENLAFAXINE HCL ER 37.5 MG PO CP24: 37.5000 mg | ORAL_CAPSULE | Freq: Every day | ORAL | 3 refills | Status: DC

## 2021-02-05 NOTE — Assessment & Plan Note (Signed)
Would like to get started on PrEP. Will check labs and start truvada. Will follow up on labs quarterly. Call with any concerns.

## 2021-02-05 NOTE — Progress Notes (Signed)
There were no vitals taken for this visit.   Subjective:    Patient ID: Willie Gilbert, male    DOB: 1995/12/23, 26 y.o.   MRN: [id number]  HPI: Willie Gilbert is a 26 y.o. male  Chief Complaint  Patient presents with   Depression    Follow up, patient has been out of wellbutrin for about 3 weeks. Would like refill but would like dosage changed.    DEPRESSION- has been doing some pretty deep therapy dealing with his trauma. 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 He is feeling like he is feeling blunted on his medicine again. He notes that he has been a lot more angry and seems to be flying off the handle.  Mood status: exacerbated Satisfied with current treatment?: no Symptom severity: moderate  Duration of current treatment : chronic Side effects: yes Medication compliance: excellent compliance Psychotherapy/counseling: yes  Previous psychiatric medications: prozac, wellbutrin Depressed mood: yes Anxious mood: yes Anhedonia: no Significant weight loss or gain: no Insomnia: no  Fatigue: yes Feelings of worthlessness or guilt: yes Impaired concentration/indecisiveness: no Suicidal ideations: no Hopelessness: no Crying spells: no Depression screen [facility] 2/9 02/05/2021 06/01/2020 11/28/2019 03/13/2017 06/19/2016  Decreased Interest 3 3 3 1 2   Down, Depressed, Hopeless 2 2 3  0 1  PHQ - 2 Score 5 5 6 1 3   Altered sleeping 3 2 3 3 2   Tired, decreased energy 3 3 3 2 2   Change in appetite 3 0 3 2 2   Feeling bad or failure about yourself  1 3 3  0 0  Trouble concentrating 3 3 2  0 2  Moving slowly or fidgety/restless 1 0 0 0 0  Suicidal thoughts 1 0 0 0 0  PHQ-9 Score 20 16 20 8 11   Difficult doing work/chores Somewhat difficult Very difficult Very difficult - -   Would like to start PrEP.   Relevant past medical, surgical, family and social history reviewed and updated as indicated. Interim medical history since our last visit reviewed. Allergies and medications reviewed and  updated.  Review of Systems  Constitutional: Negative.   Respiratory: Negative.    Cardiovascular: Negative.   Musculoskeletal: Negative.   Psychiatric/Behavioral: Negative.     Per HPI unless specifically indicated above     Objective:    There were no vitals taken for this visit.  Wt Readings from Last 3 Encounters:  11/28/19 145 lb (65.8 kg)  05/28/17 147 lb 8 oz (66.9 kg)  03/13/17 144 lb 4 oz (65.4 kg)    Physical Exam Vitals and nursing note reviewed.  Constitutional:      General: He is not in acute distress.    Appearance: Normal appearance. He is not ill-appearing, toxic-appearing or diaphoretic.  HENT:     Head: Normocephalic and atraumatic.     Right Ear: External ear normal.     Left Ear: External ear normal.     Nose: Nose normal.     Mouth/Throat:     Mouth: Mucous membranes are moist.     Pharynx: Oropharynx is clear.  Eyes:     General: No scleral icterus.       Right eye: No discharge.        Left eye: No discharge.     Conjunctiva/sclera: Conjunctivae normal.     Pupils: Pupils are equal, round, and reactive to light.  Pulmonary:     Effort: Pulmonary effort is normal. No respiratory distress.     Comments:  Speaking in full sentences Musculoskeletal:        General: Normal range of motion.     Cervical back: Normal range of motion.  Skin:    Coloration: Skin is not jaundiced or pale.     Findings: No bruising, erythema, lesion or rash.  Neurological:     Mental Status: He is alert and oriented to person, place, and time. Mental status is at baseline.  Psychiatric:        Mood and Affect: Mood normal.        Behavior: Behavior normal.        Thought Content: Thought content normal.        Judgment: Judgment normal.    Results for orders placed or performed in visit on 05/28/17  Bayer DCA Hb A1c Waived  Result Value Ref Range   HB A1C (BAYER DCA - WAIVED) 4.9 <7.0 %      Assessment & Plan:   Problem List Items Addressed This Visit        Other   Depression, major, recurrent (HCC) - Primary    Will change to effexor and recheck 1 month. Call with any concerns.       Relevant Medications   venlafaxine XR (EFFEXOR XR) 37.5 MG 24 hr capsule   Other Relevant Orders   Comprehensive metabolic panel   CBC with Differential/Platelet   High risk sexual behavior    Would like to get started on PrEP. Will check labs and start truvada. Will follow up on labs quarterly. Call with any concerns.       Relevant Orders   HIV Antibody (routine testing w rflx)   GC/Chlamydia Probe Amp   RPR   HSV(herpes simplex vrs) 1+2 ab-IgG   Acute Viral Hepatitis (HAV, HBV, HCV)   Other Visit Diagnoses     Concentration deficit       Will get him in for neuropsych testing. Await results. Call with any concerns.    Relevant Orders   Ambulatory referral to Neuropsychology   Comprehensive metabolic panel   CBC with Differential/Platelet   TSH   Screening for cholesterol level       Will get labs drawn. Await results.    Relevant Orders   Lipid Panel w/o Chol/HDL Ratio        Follow up plan: Return in about 4 weeks (around 03/05/2021).   This visit was completed via video visit through MyChart due to the restrictions of the COVID-19 pandemic. All issues as above were discussed and addressed. Physical exam was done as above through visual confirmation on video through MyChart. If it was felt that the patient should be evaluated in the office, they were directed there. The patient verbally consented to this visit. Location of the patient: home Location of the provider: work Those involved with this call:  Provider: Olevia Perches, DO CMA: Rolley Sims, CMA Front Desk/Registration: [employer]  Time spent on call:  25 minutes with patient face to face via video conference. More than 50% of this time was spent in counseling and coordination of care. 30 minutes total spent in review of patient's record and preparation of their  chart.

## 2021-02-05 NOTE — Assessment & Plan Note (Signed)
Will change to effexor and recheck 1 month. Call with any concerns.

## 2021-02-06 ENCOUNTER — Telehealth: Payer: Self-pay | Admitting: Family Medicine

## 2021-02-06 NOTE — Telephone Encounter (Signed)
Lmom asking pt to call back to schedule 4 week follow up with Dr. Laural Benes. Virtual visit is ok per provider

## 2021-02-06 NOTE — Progress Notes (Signed)
Lmom asking pt to call back to schedule 4 week follow up

## 2021-02-07 NOTE — Progress Notes (Signed)
2nd attempt-Lmom asking pt to call back to schedule appt. °

## 2021-02-08 ENCOUNTER — Telehealth: Payer: Self-pay | Admitting: Family Medicine

## 2021-02-08 NOTE — Telephone Encounter (Signed)
Copied from CRM [id number]. Topic: General - Other >> Feb 08, 2021  8:21 AM Traci Sermon wrote: Reason for CRM: [facility] in [place], [place] [phone] called in stating the referral that was sent over for the pt, they are not covered in the pt insurance and the pt would have to pay out of pocket. Please advise.

## 2021-02-08 NOTE — Telephone Encounter (Signed)
Can we send referral somewhere else please?

## 2021-02-19 ENCOUNTER — Telehealth: Payer: Self-pay

## 2021-02-19 NOTE — Telephone Encounter (Signed)
Attempted to contact [facility] in [place] - [address], [place], [place] [address], place is permanently closed.  Contacted patient, LVM asking patient if there was a location where he would like his labs drawn.

## 2021-03-07 ENCOUNTER — Telehealth (INDEPENDENT_AMBULATORY_CARE_PROVIDER_SITE_OTHER): Payer: 59 | Admitting: Family Medicine

## 2021-03-07 ENCOUNTER — Encounter: Payer: Self-pay | Admitting: Family Medicine

## 2021-03-07 DIAGNOSIS — F331 Major depressive disorder, recurrent, moderate: Secondary | ICD-10-CM | POA: Diagnosis not present

## 2021-03-07 DIAGNOSIS — Z7251 High risk heterosexual behavior: Secondary | ICD-10-CM

## 2021-03-07 MED ORDER — VENLAFAXINE HCL ER 75 MG PO CP24: 75.0000 mg | ORAL_CAPSULE | Freq: Every day | ORAL | 2 refills | Status: DC

## 2021-03-07 NOTE — Progress Notes (Signed)
There were no vitals taken for this visit.   Subjective:    Patient ID: Willie Gilbert, male    DOB: Feb 02, 1996, 26 y.o.   MRN: [id number]  HPI: Willie Gilbert is a 26 y.o. male  Chief Complaint  Patient presents with   Depression   DEPRESSION Mood status: better Satisfied with current treatment?: yes Symptom severity: mild  Duration of current treatment :  1 month Side effects: no Medication compliance: excellent compliance Psychotherapy/counseling: no  Previous psychiatric medications: effexor, lexapro, sertraline, prozac Depressed mood: yes Anxious mood: yes Anhedonia: no Significant weight loss or gain: no Insomnia: no  Fatigue: no Feelings of worthlessness or guilt: no Impaired concentration/indecisiveness: no Suicidal ideations: no Hopelessness: no Crying spells: no Depression screen [facility] 2/9 03/07/2021 02/05/2021 06/01/2020 11/28/2019 03/13/2017  Decreased Interest 0 3 3 3 1   Down, Depressed, Hopeless 0 2 2 3  0  PHQ - 2 Score 0 5 5 6 1   Altered sleeping 2 3 2 3 3   Tired, decreased energy 1 3 3 3 2   Change in appetite 2 3 0 3 2  Feeling bad or failure about yourself  0 1 3 3  0  Trouble concentrating 1 3 3 2  0  Moving slowly or fidgety/restless 1 1 0 0 0  Suicidal thoughts 0 1 0 0 0  PHQ-9 Score 7 20 16 20 8   Difficult doing work/chores - Somewhat difficult Very difficult Very difficult -    Relevant past medical, surgical, family and social history reviewed and updated as indicated. Interim medical history since our last visit reviewed. Allergies and medications reviewed and updated.  Review of Systems  Constitutional: Negative.   Respiratory: Negative.    Cardiovascular: Negative.   Gastrointestinal: Negative.   Psychiatric/Behavioral: Negative.     Per HPI unless specifically indicated above     Objective:    There were no vitals taken for this visit.  Wt Readings from Last 3 Encounters:  11/28/19 145 lb (65.8 kg)  05/28/17 147 lb 8 oz (66.9  kg)  03/13/17 144 lb 4 oz (65.4 kg)    Physical Exam Vitals and nursing note reviewed.  Constitutional:      General: He is not in acute distress.    Appearance: Normal appearance. He is not ill-appearing, toxic-appearing or diaphoretic.  HENT:     Head: Normocephalic and atraumatic.     Right Ear: External ear normal.     Left Ear: External ear normal.     Nose: Nose normal.     Mouth/Throat:     Mouth: Mucous membranes are moist.     Pharynx: Oropharynx is clear.  Eyes:     General: No scleral icterus.       Right eye: No discharge.        Left eye: No discharge.     Conjunctiva/sclera: Conjunctivae normal.     Pupils: Pupils are equal, round, and reactive to light.  Pulmonary:     Effort: Pulmonary effort is normal. No respiratory distress.     Comments: Speaking in full sentences Musculoskeletal:        General: Normal range of motion.     Cervical back: Normal range of motion.  Skin:    Coloration: Skin is not jaundiced or pale.     Findings: No bruising, erythema, lesion or rash.  Neurological:     Mental Status: He is alert and oriented to person, place, and time. Mental status is at baseline.  Psychiatric:  Mood and Affect: Mood normal.        Behavior: Behavior normal.        Thought Content: Thought content normal.        Judgment: Judgment normal.    Results for orders placed or performed in visit on 05/28/17  Bayer DCA Hb A1c Waived  Result Value Ref Range   HB A1C (BAYER DCA - WAIVED) 4.9 <7.0 %      Assessment & Plan:   Problem List Items Addressed This Visit       Other   Depression, major, recurrent (HCC) - Primary    Doing much better on his meds. Will increase to 75. Recheck 1 month.       Relevant Medications   venlafaxine XR (EFFEXOR XR) 75 MG 24 hr capsule   High risk sexual behavior    Will let us know where to send labs. Then we'll get him started on PrEP        Follow up plan: Return As soon as his lab results are back/ 1  month.    This visit was completed via video visit through MyChart due to the restrictions of the COVID-19 pandemic. All issues as above were discussed and addressed. Physical exam was done as above through visual confirmation on video through MyChart. If it was felt that the patient should be evaluated in the office, they were directed there. The patient verbally consented to this visit. Location of the patient: home Location of the provider: work Those involved with this call:  Provider: Olevia Perches, DO CMA: Rolley Sims, CMA Front Desk/Registration: [employer]  Time spent on call:  15 minutes with patient face to face via video conference. More than 50% of this time was spent in counseling and coordination of care. 23 minutes total spent in review of patient's record and preparation of their chart.

## 2021-03-07 NOTE — Assessment & Plan Note (Signed)
Doing much better on his meds. Will increase to 75. Recheck 1 month.

## 2021-03-07 NOTE — Assessment & Plan Note (Signed)
Will let us know where to send labs. Then we'll get him started on PrEP

## 2021-03-08 NOTE — Progress Notes (Signed)
Lmom asking pt to call back to schedule an appt. °

## 2021-03-11 NOTE — Progress Notes (Signed)
2nd attempt- Lmom asking pt to call back to scheuled fu appt

## 2021-03-12 ENCOUNTER — Telehealth: Payer: Self-pay | Admitting: Family Medicine

## 2021-03-12 NOTE — Telephone Encounter (Signed)
Called pt to get his follow up scheduled. Pt states that he contacted student services to get his labs done and they said that it would be better if pt went to [place] asks for lab orders to be sent to [facility] in [place], [place].He specifically said the [facility] by [place]. [address], [place] [address]. Please call pt when orders have been sent.

## 2021-03-12 NOTE — Telephone Encounter (Signed)
I scheduled his fu already, I'll let him know

## 2021-04-08 ENCOUNTER — Telehealth: Payer: 59 | Admitting: Family Medicine

## 2021-11-27 ENCOUNTER — Ambulatory Visit: Payer: Self-pay

## 2021-11-27 NOTE — Telephone Encounter (Signed)
  Chief Complaint: wanting to switch back to Wellbutrin from Effexor Symptoms: tearful, lack of interest in school, irritable  Frequency: 4 weeks Pertinent Negatives: Patient denies denies SI Disposition: [] ED /[] Urgent Care (no appt availability in office) / [x] Appointment(In office/virtual)/ []  [facility]/ [] Home Care/ [] Refused Recommended Disposition /[] [facility]/ []  Follow-up with PCP Additional Notes: No available appts with PCP. Virtual appt made 11/28/21 With another provider.  Reason for Disposition  [1] Started on anti-anxiety medication AND [2] no relief  Answer Assessment - Initial Assessment Questions 1. CONCERN: "Did anything happen that prompted you to call today?"      Feeling emotional and irritable  2. ANXIETY SYMPTOMS: "Can you describe how you (your loved one; patient) have been feeling?" (e.g., tense, restless, panicky, anxious, keyed up, overwhelmed, sense of impending doom).      Anxious, irritated 3. ONSET: "How long have you been feeling this way?" (e.g., hours, days, weeks)     4 weeks 4. SEVERITY: "How would you rate the level of anxiety?" (e.g., 0 - 10; or mild, moderate, severe).     N/a 5. FUNCTIONAL IMPAIRMENT: "How have these feelings affected your ability to do daily activities?" "Have you had more difficulty than usual doing your normal daily activities?" (e.g., getting better, same, worse; self-care, school, work, interactions)     Wanting to withdraw from school and wanting to give up 6. HISTORY: "Have you felt this way before?" "Have you ever been diagnosed with an anxiety problem in the past?" (e.g., generalized anxiety disorder, panic attacks, PTSD). If Yes, ask: "How was this problem treated?" (e.g., medicines, counseling, etc.)     Depression and anxiety- wants to wean off effexor and go back to Wellbutrin 7. RISK OF HARM - SUICIDAL IDEATION: "Do you ever have thoughts of hurting or killing yourself?" If Yes, ask:  "Do you have  these feelings now?" "Do you have a plan on how you would do this?"     no 8. TREATMENT:  "What has been done so far to treat this anxiety?" (e.g., medicines, relaxation strategies). "What has helped?"     medicines 9. TREATMENT - THERAPIST: "Do you have a counselor or therapist? Name?"     no 10. POTENTIAL TRIGGERS: "Do you drink caffeinated beverages (e.g., coffee, colas, teas), and how much daily?" "Do you drink alcohol or use any drugs?" "Have you started any new medicines recently?"       Yes coffee in am  11. PATIENT SUPPORT: "Who is with you now?" "Who do you live with?" "Do you have family or friends who you can talk to?"        At college in [place]- yes 12. OTHER SYMPTOMS: "Do you have any other symptoms?" (e.g., feeling depressed, trouble concentrating, trouble sleeping, trouble breathing, palpitations or fast heartbeat, chest pain, sweating, nausea, or diarrhea)       Depressed and anxious, tearful  13. PREGNANCY: "Is there any chance you are pregnant?" "When was your last menstrual period?"       N/a  Protocols used: Anxiety and Panic Attack-A-AH

## 2021-11-28 ENCOUNTER — Encounter: Payer: Self-pay | Admitting: Physician Assistant

## 2021-11-28 ENCOUNTER — Telehealth (INDEPENDENT_AMBULATORY_CARE_PROVIDER_SITE_OTHER): Payer: 59 | Admitting: Physician Assistant

## 2021-11-28 DIAGNOSIS — F332 Major depressive disorder, recurrent severe without psychotic features: Secondary | ICD-10-CM

## 2021-11-28 DIAGNOSIS — F411 Generalized anxiety disorder: Secondary | ICD-10-CM

## 2021-11-28 MED ORDER — BUPROPION HCL ER (XL) 150 MG PO TB24: 150.0000 mg | ORAL_TABLET | Freq: Every day | ORAL | 1 refills | Status: DC

## 2021-11-28 NOTE — Progress Notes (Signed)
Virtual Visit via Video Note  I connected with Willie Gilbert on 11/29/21 at 10:40 AM EDT by a video enabled telemedicine application and verified that I am speaking with the correct person using two identifiers.  Today's Provider: Talitha Givens, MHS, PA-C Introduced myself to the patient as a PA-C and provided education on APPs in clinical practice.   Location: Patient: Willie Gilbert, [place]  Provider: [facility], [place], [place]    I discussed the limitations of evaluation and management by telemedicine and the availability of in person appointments. The patient expressed understanding and agreed to proceed.  Chief Complaint  Patient presents with   Depression    Pt states he stopped taking the Effexor about a month ago, would like to discuss going back on Wellbutrin.    Anxiety      History of Present Illness:  He reports that recently he switched to Effexor from wellbutrin Stopped in Sept because of insomnia and reflux Reports now he is having more intense emotions and impulse control issues Reports increase irritability,hypersomnia  during the day and struggle sleeping at night  Would like to go back on Wellbutrin because he feels like it helped him control his emotions more - feels like he is lashing out  Denies violence  Reports significant ED with Wellbutrin but is concerned wit emotional responses to triggers right now  He is not seeing a therapist yet but is going to reach out to campus services to see what is offered and now has insurance through work - wants to start looking around for therapists that are available and covered through his insurance  Reports significant stress from school classes and full work schedule  Reports the season change is rough for him as this brings up memories of his father passing away 3 years ago    ANXIETY/STRESS Duration:uncontrolled Anxious mood: yes  Excessive worrying: yes Irritability: yes  Sweating: no Nausea:  no Palpitations:no Hyperventilation: no Panic attacks: no Agoraphobia: no  Obscessions/compulsions: yes states he will get stuck with things that are annoying and leads him to think about dropping out of college Depressed mood: yes    11/28/2021   10:46 AM 03/07/2021    4:33 PM 02/05/2021    4:44 PM 06/01/2020    2:58 PM 11/28/2019    2:15 PM  Depression screen PHQ 2/9  Decreased Interest 3 0 3 3 3   Down, Depressed, Hopeless 2 0 2 2 3   PHQ - 2 Score 5 0 5 5 6   Altered sleeping 3 2 3 2 3   Tired, decreased energy 3 1 3 3 3   Change in appetite 3 2 3  0 3  Feeling bad or failure about yourself  2 0 1 3 3   Trouble concentrating 2 1 3 3 2   Moving slowly or fidgety/restless 1 1 1  0 0  Suicidal thoughts 0 0 1 0 0  PHQ-9 Score 19 7 20 16 20   Difficult doing work/chores Very difficult  Somewhat difficult Very difficult Very difficult   Anhedonia: yes- 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 and doing this for enjoyment  Weight changes: no Insomnia: yes hard to fall asleep  Hypersomnia: yes during the day  Fatigue/loss of energy: yes Feelings of worthlessness: yes Feelings of guilt: yes Impaired concentration/indecisiveness: yes Suicidal ideations: no  Crying spells: yes Recent Stressors/Life Changes: yes   Relationship problems: yes- concerned for how he's interacting with friends due to anger    Family stress: no  Financial stress: no    Job stress: yes    Recent death/loss:  Father passed away at the end of 05/26/18      11/28/2021   10:48 AM 03/07/2021    4:34 PM 02/05/2021    4:46 PM 06/01/2020    3:00 PM  GAD 7 : Generalized Anxiety Score  Nervous, Anxious, on Edge 2 0 0 1  Control/stop worrying 2 1 1 1   Worry too much - different things 1 1 1 2   Trouble relaxing 2 1 1 2   Restless 2 1 3 2   Easily annoyed or irritable 3 0 3 1  Afraid - awful might happen 1 1 1 2   Total GAD 7 Score 13 5 10 11   Anxiety Difficulty Very difficult  Somewhat difficult Somewhat  difficult      Review of Systems  Psychiatric/Behavioral:  Positive for depression. Negative for suicidal ideas. The patient is nervous/anxious.        Irritability       Observations/Objective:  Due to the nature of the virtual visit, physical exam and observations are limited. Able to obtain the following observations:   Alert, oriented x 3  Appears comfortable, in no acute distress.  No scleral injection, no appreciated hoarseness, tachypnea, wheeze or strider. Able to maintain conversation without visible strain.     Assessment and Plan:  Problem List Items Addressed This Visit       Other   Depression, major, recurrent (HCC) - Primary    Chronic, ongoing Currently unmanaged as he stopped taking Effexor about a month ago due to concerns for insomnia and reflux symptoms Reports increased anxiety, irritability, resurgence of depression at this time We discussed his previous experience on Wellbutrin and he is amenable to restarting this to provide more control over symptoms  Recommend starting Wellbutrin 150 mg PO QD for now and follow up in 6 weeks to see if mood is more stabilized       Relevant Medications   buPROPion (WELLBUTRIN XL) 150 MG 24 hr tablet   Anxiety, generalized    Chronic, ongoing Not currently managed and is exacerbated Reports increased irritability and stress as well as sleep changes  He is trying to find a therapist that is in line with his goals -encouraged this endeavor  He is amenable to restarting Wellbutrin 150 mg PO QD at this time Recommend providing Wellbutrin to stabilize mood and follow up in 6 weeks to discuss progress and if further changes are needed- he is amenable to this and provided verbal agreement Follow up in 6 weeks to discuss response       Relevant Medications   buPROPion (WELLBUTRIN XL) 150 MG 24 hr tablet   Follow Up Instructions:    I discussed the assessment and treatment plan with the patient. The patient was  provided an opportunity to ask questions and all were answered. The patient agreed with the plan and demonstrated an understanding of the instructions.   The patient was advised to call back or seek an in-person evaluation if the symptoms worsen or if the condition fails to improve as anticipated.  I provided 25 minutes of non-face-to-face time during this encounter.   Return in about 6 weeks (around 01/09/2022) for Depression, anxiety.   I, Yafet Cline E Korie Streat, PA-C, have reviewed all documentation for this visit. The documentation on 11/29/21 for the exam, diagnosis, procedures, and orders are all accurate and complete.   , MHS, PA-C [facility]

## 2021-11-29 DIAGNOSIS — F411 Generalized anxiety disorder: Secondary | ICD-10-CM | POA: Insufficient documentation

## 2021-11-29 NOTE — Assessment & Plan Note (Signed)
Chronic, ongoing Currently unmanaged as he stopped taking Effexor about a month ago due to concerns for insomnia and reflux symptoms Reports increased anxiety, irritability, resurgence of depression at this time We discussed his previous experience on Wellbutrin and he is amenable to restarting this to provide more control over symptoms  Recommend starting Wellbutrin 150 mg PO QD for now and follow up in 6 weeks to see if mood is more stabilized

## 2021-11-29 NOTE — Assessment & Plan Note (Signed)
Chronic, ongoing Not currently managed and is exacerbated Reports increased irritability and stress as well as sleep changes  He is trying to find a therapist that is in line with his goals -encouraged this endeavor  He is amenable to restarting Wellbutrin 150 mg PO QD at this time Recommend providing Wellbutrin to stabilize mood and follow up in 6 weeks to discuss progress and if further changes are needed- he is amenable to this and provided verbal agreement Follow up in 6 weeks to discuss response

## 2022-02-06 ENCOUNTER — Other Ambulatory Visit: Payer: Self-pay | Admitting: Physician Assistant

## 2022-02-06 DIAGNOSIS — F411 Generalized anxiety disorder: Secondary | ICD-10-CM

## 2022-02-06 DIAGNOSIS — F332 Major depressive disorder, recurrent severe without psychotic features: Secondary | ICD-10-CM

## 2022-02-07 MED ORDER — BUPROPION HCL ER (XL) 150 MG PO TB24: 150.0000 mg | ORAL_TABLET | Freq: Every day | ORAL | 0 refills | Status: DC

## 2022-02-19 ENCOUNTER — Telehealth: Payer: Self-pay | Admitting: Family Medicine

## 2022-02-19 NOTE — Telephone Encounter (Signed)
Spoke with Pt and he needed OV note and not a note written by a provider.

## 2022-02-19 NOTE — Telephone Encounter (Signed)
Copied from [place] [id number] [id number]. Topic: General - Inquiry >> Feb 18, 2022  4:48 PM Penni Bombard wrote: Reason for CRM: pt called needing a letter for financial aid for school.  He needs something stating he has been having emotional crisis and depression this past semester.  He is currently on medication for anxiety and depression.  He fell behind this past semester and needs something supporting this.  CB# [phone]

## 2022-03-03 ENCOUNTER — Other Ambulatory Visit: Payer: Self-pay | Admitting: Family Medicine

## 2022-03-03 DIAGNOSIS — F332 Major depressive disorder, recurrent severe without psychotic features: Secondary | ICD-10-CM

## 2022-03-03 DIAGNOSIS — F411 Generalized anxiety disorder: Secondary | ICD-10-CM

## 2022-03-04 NOTE — Telephone Encounter (Signed)
Requested Prescriptions  Pending Prescriptions Disp Refills   buPROPion (WELLBUTRIN XL) 150 MG 24 hr tablet [Pharmacy Med Name: buPROPion HCl ER (XL) 150 MG Oral Tablet Extended Release 24 Hour] 90 tablet 0    Sig: Take 1 tablet by mouth once daily     Psychiatry: Antidepressants - bupropion Failed - 03/03/2022 11:32 AM      Failed - Cr in normal range and within 360 days    No results found for: "CREATININE", "LABCREAU", "LABCREA", "POCCRE"       Failed - AST in normal range and within 360 days    No results found for: "POCAST", "AST"       Failed - ALT in normal range and within 360 days    No results found for: "ALT", "LABALT", "POCALT"       Failed - Last BP in normal range    BP Readings from Last 1 Encounters:  05/28/17 119/73         Passed - Completed PHQ-2 or PHQ-9 in the last 360 days      Passed - Valid encounter within last 6 months    Recent Outpatient Visits           3 months ago Severe episode of recurrent major depressive disorder, without psychotic features ([place])   [place], Erin E, PA-C   12 months ago Moderate episode of recurrent major depressive disorder ([place])   [place], Megan P, DO   1 year ago Moderate episode of recurrent major depressive disorder [facility])   [place], Megan P, DO   1 year ago Moderate episode of recurrent major depressive disorder [facility])   [place], Megan P, DO   2 years ago Moderate episode of recurrent major depressive disorder [facility])   [place], Megan P, DO

## 2022-07-03 ENCOUNTER — Telehealth: Payer: Self-pay | Admitting: Family Medicine

## 2022-07-03 NOTE — Telephone Encounter (Signed)
Patient has not been seen in over a year. Needs an appointment.

## 2022-07-03 NOTE — Telephone Encounter (Signed)
Medication Refill - Medication: buPROPion (WELLBUTRIN XL) 150 MG 24 hr tablet [[id number]]   Has the patient contacted their pharmacy? Yes.   (Agent: If no, request that the patient contact the pharmacy for the refill. If patient does not wish to contact the pharmacy document the reason why and proceed with request.) (Agent: If yes, when and what did the pharmacy advise?)  Preferred Pharmacy (with phone number or street name): [id number] - [place], [place] - [address] [place] [address] Phone: [phone]  Fax: [phone]  Has the patient been seen for an appointment in the last year OR does the patient have an upcoming appointment? Yes.    Agent: Please be advised that RX refills may take up to 3 business days. We ask that you follow-up with your pharmacy.

## 2022-07-25 ENCOUNTER — Encounter: Payer: Self-pay | Admitting: Family Medicine

## 2022-07-25 ENCOUNTER — Telehealth (INDEPENDENT_AMBULATORY_CARE_PROVIDER_SITE_OTHER): Payer: Self-pay | Admitting: Family Medicine

## 2022-07-25 DIAGNOSIS — F411 Generalized anxiety disorder: Secondary | ICD-10-CM

## 2022-07-25 DIAGNOSIS — F332 Major depressive disorder, recurrent severe without psychotic features: Secondary | ICD-10-CM

## 2022-07-25 MED ORDER — VENLAFAXINE HCL ER 37.5 MG PO CP24: 37.5000 mg | ORAL_CAPSULE | Freq: Every day | ORAL | 2 refills | Status: DC

## 2022-07-25 NOTE — Assessment & Plan Note (Signed)
Acting up. Will restart him on effexor and recheck in about 6 weeks. Call with any concerns.

## 2022-07-25 NOTE — Assessment & Plan Note (Signed)
Acting up. Will restart him on effexor and recheck in about 6 weeks. Call with any concerns.  

## 2022-07-25 NOTE — Progress Notes (Signed)
There were no vitals taken for this visit.   Subjective:    Patient ID: Willie Gilbert, male    DOB: [birth year], 27 y.o.   MRN: [id number]  HPI: Willie Gilbert is a 27 y.o. male  Chief Complaint  Patient presents with   Medication Consultation    Patient says he would like to discuss possibly restarting the Venlafaxine prescription as he has stopped the Wellbutrin prescription.    Depression    Patient says his transition between jobs and the stressors of his job and then financial aspect of it.    ANXIETY/STRESS Duration: chronic Status:exacerbated Anxious mood: yes  Excessive worrying: yes Irritability: yes  Sweating: no Nausea: no Palpitations:no Hyperventilation: no Panic attacks: no Agoraphobia: yes  Obscessions/compulsions: no Depressed mood: yes    07/25/2022    2:56 PM 11/28/2021   10:46 AM 03/07/2021    4:33 PM 02/05/2021    4:44 PM 06/01/2020    2:58 PM  Depression screen PHQ 2/9  Decreased Interest 3 3 0 3 3  Down, Depressed, Hopeless 2 2 0 2 2  PHQ - 2 Score 5 5 0 5 5  Altered sleeping 3 3 2 3 2   Tired, decreased energy 3 3 1 3 3   Change in appetite 2 3 2 3  0  Feeling bad or failure about yourself  1 2 0 1 3  Trouble concentrating 3 2 1 3 3   Moving slowly or fidgety/restless 2 1 1 1  0  Suicidal thoughts 1 0 0 1 0  PHQ-9 Score 20 19 7 20 16   Difficult doing work/chores Extremely dIfficult Very difficult  Somewhat difficult Very difficult      07/25/2022    2:57 PM 11/28/2021   10:48 AM 03/07/2021    4:34 PM 02/05/2021    4:46 PM  GAD 7 : Generalized Anxiety Score  Nervous, Anxious, on Edge 2 2 0 0  Control/stop worrying 2 2 1 1   Worry too much - different things 2 1 1 1   Trouble relaxing 2 2 1 1   Restless 2 2 1 3   Easily annoyed or irritable 3 3 0 3  Afraid - awful might happen 2 1 1 1   Total GAD 7 Score 15 13 5 10   Anxiety Difficulty Somewhat difficult Very difficult  Somewhat difficult   Anhedonia: no Weight changes: no Insomnia: no    Hypersomnia: yes Fatigue/loss of energy: yes Feelings of worthlessness: no Feelings of guilt: no Impaired concentration/indecisiveness: no Suicidal ideations: no  Crying spells: no Recent Stressors/Life Changes: no   Relationship problems: no   Family stress: no     Financial stress: yes    Job stress: yes    Recent death/loss: no  Relevant past medical, surgical, family and social history reviewed and updated as indicated. Interim medical history since our last visit reviewed. Allergies and medications reviewed and updated.  Review of Systems  Constitutional: Negative.   Respiratory: Negative.    Cardiovascular: Negative.   Gastrointestinal: Negative.   Musculoskeletal: Negative.   Skin: Negative.   Psychiatric/Behavioral:  Positive for dysphoric mood. Negative for agitation, behavioral problems, confusion, decreased concentration, hallucinations, self-injury, sleep disturbance and suicidal ideas. The patient is nervous/anxious. The patient is not hyperactive.     Per HPI unless specifically indicated above     Objective:    There were no vitals taken for this visit.  Wt Readings from Last 3 Encounters:  11/28/19 145 lb (65.8 kg)  05/28/17 147 lb 8 oz (  66.9 kg)  03/13/17 144 lb 4 oz (65.4 kg)    Physical Exam Vitals and nursing note reviewed.  Constitutional:      General: He is not in acute distress.    Appearance: Normal appearance. He is not ill-appearing, toxic-appearing or diaphoretic.  HENT:     Head: Normocephalic and atraumatic.     Right Ear: External ear normal.     Left Ear: External ear normal.     Nose: Nose normal.     Mouth/Throat:     Mouth: Mucous membranes are moist.     Pharynx: Oropharynx is clear.  Eyes:     General: No scleral icterus.       Right eye: No discharge.        Left eye: No discharge.     Conjunctiva/sclera: Conjunctivae normal.     Pupils: Pupils are equal, round, and reactive to light.  Pulmonary:     Effort: Pulmonary  effort is normal. No respiratory distress.     Comments: Speaking in full sentences Musculoskeletal:        General: Normal range of motion.     Cervical back: Normal range of motion.  Skin:    Coloration: Skin is not jaundiced or pale.     Findings: No bruising, erythema, lesion or rash.  Neurological:     Mental Status: He is alert and oriented to person, place, and time. Mental status is at baseline.  Psychiatric:        Mood and Affect: Mood normal.        Behavior: Behavior normal.        Thought Content: Thought content normal.        Judgment: Judgment normal.     Results for orders placed or performed in visit on 05/28/17  Bayer DCA Hb A1c Waived  Result Value Ref Range   HB A1C (BAYER DCA - WAIVED) 4.9 <7.0 %      Assessment & Plan:   Problem List Items Addressed This Visit       Other   Depression, major, recurrent (HCC)    Acting up. Will restart him on effexor and recheck in about 6 weeks. Call with any concerns.       Relevant Medications   venlafaxine XR (EFFEXOR XR) 37.5 MG 24 hr capsule   Anxiety, generalized - Primary    Acting up. Will restart him on effexor and recheck in about 6 weeks. Call with any concerns.       Relevant Medications   venlafaxine XR (EFFEXOR XR) 37.5 MG 24 hr capsule     Follow up plan: Return in about 6 weeks (around 09/05/2022).    This visit was completed via video visit through MyChart due to the restrictions of the COVID-19 pandemic. All issues as above were discussed and addressed. Physical exam was done as above through visual confirmation on video through MyChart. If it was felt that the patient should be evaluated in the office, they were directed there. The patient verbally consented to this visit. Location of the patient: parking lot Location of the provider: work Those involved with this call:  Provider: Olevia Perches, DO CMA: Malen Gauze, CMA Front Desk/Registration:  Servando Snare   Time spent on call:  15  minutes with patient face to face via video conference. More than 50% of this time was spent in counseling and coordination of care. 23 minutes total spent in review of patient's record and preparation of their chart.

## 2022-07-28 NOTE — Progress Notes (Signed)
Called and scheduled patient on 09/15/2022 @ 3:40 via MyChart video visit.

## 2022-09-15 ENCOUNTER — Telehealth (INDEPENDENT_AMBULATORY_CARE_PROVIDER_SITE_OTHER): Payer: Self-pay | Admitting: Family Medicine

## 2022-09-15 ENCOUNTER — Encounter: Payer: Self-pay | Admitting: Family Medicine

## 2022-09-15 DIAGNOSIS — F331 Major depressive disorder, recurrent, moderate: Secondary | ICD-10-CM

## 2022-09-15 DIAGNOSIS — F411 Generalized anxiety disorder: Secondary | ICD-10-CM

## 2022-09-15 MED ORDER — VENLAFAXINE HCL ER 75 MG PO CP24: 75.0000 mg | ORAL_CAPSULE | Freq: Every day | ORAL | 2 refills | Status: DC

## 2022-09-15 NOTE — Assessment & Plan Note (Signed)
Doing better, but not quite there. Will increase his effexor to 75mg  and recheck 6 weeks. Call with any concerns.

## 2022-09-15 NOTE — Progress Notes (Signed)
There were no vitals taken for this visit.   Subjective:    Patient ID: Willie Gilbert, male    DOB: Apr 30, 1995, 27 y.o.   MRN: [id number]  HPI: Willie Gilbert is a 27 y.o. male  Chief Complaint  Patient presents with   Anxiety   ANXIETY/DEPRESSION Duration: chronic Status:better Anxious mood: yes  Excessive worrying: yes Irritability: no  Sweating: no Nausea: no Palpitations:no Hyperventilation: no Panic attacks: no Agoraphobia: no  Obscessions/compulsions: no Depressed mood: yes    09/15/2022    3:51 PM 07/25/2022    2:56 PM 11/28/2021   10:46 AM 03/07/2021    4:33 PM 02/05/2021    4:44 PM  Depression screen PHQ 2/9  Decreased Interest 1 3 3  0 3  Down, Depressed, Hopeless 1 2 2  0 2  PHQ - 2 Score 2 5 5  0 5  Altered sleeping 2 3 3 2 3   Tired, decreased energy 1 3 3 1 3   Change in appetite 1 2 3 2 3   Feeling bad or failure about yourself  0 1 2 0 1  Trouble concentrating 1 3 2 1 3   Moving slowly or fidgety/restless 1 2 1 1 1   Suicidal thoughts 0 1 0 0 1  PHQ-9 Score 8 20 19 7 20   Difficult doing work/chores Not difficult at all Extremely dIfficult Very difficult  Somewhat difficult      09/15/2022    3:52 PM 07/25/2022    2:57 PM 11/28/2021   10:48 AM 03/07/2021    4:34 PM  GAD 7 : Generalized Anxiety Score  Nervous, Anxious, on Edge 1 2 2  0  Control/stop worrying 1 2 2 1   Worry too much - different things 1 2 1 1   Trouble relaxing 1 2 2 1   Restless 1 2 2 1   Easily annoyed or irritable 1 3 3  0  Afraid - awful might happen 1 2 1 1   Total GAD 7 Score 7 15 13 5   Anxiety Difficulty Not difficult at all Somewhat difficult Very difficult    Anhedonia: no Weight changes: no Insomnia: no   Hypersomnia: no Fatigue/loss of energy: yes Feelings of worthlessness: no Feelings of guilt: no Impaired concentration/indecisiveness: no Suicidal ideations: no  Crying spells: no Recent Stressors/Life Changes: no   Relationship problems: no   Family stress: no      Financial stress: no    Job stress: no    Recent death/loss: no  Relevant past medical, surgical, family and social history reviewed and updated as indicated. Interim medical history since our last visit reviewed. Allergies and medications reviewed and updated.  Review of Systems  Constitutional: Negative.   Respiratory: Negative.    Cardiovascular: Negative.   Gastrointestinal: Negative.   Musculoskeletal: Negative.   Psychiatric/Behavioral:  Positive for dysphoric mood. Negative for agitation, behavioral problems, confusion, decreased concentration, hallucinations, self-injury, sleep disturbance and suicidal ideas. The patient is nervous/anxious. The patient is not hyperactive.     Per HPI unless specifically indicated above     Objective:    There were no vitals taken for this visit.  Wt Readings from Last 3 Encounters:  11/28/19 145 lb (65.8 kg)  05/28/17 147 lb 8 oz (66.9 kg)  03/13/17 144 lb 4 oz (65.4 kg)    Physical Exam Constitutional:      General: He is not in acute distress.    Appearance: Normal appearance. He is well-developed. He is not ill-appearing, toxic-appearing or diaphoretic.  HENT:  Head: Normocephalic and atraumatic.     Right Ear: Hearing and external ear normal.     Left Ear: Hearing and external ear normal.     Nose: Nose normal.  Eyes:     General: Lids are normal. No scleral icterus.       Right eye: No discharge.        Left eye: No discharge.     Extraocular Movements: Extraocular movements intact.     Conjunctiva/sclera: Conjunctivae normal.     Pupils: Pupils are equal, round, and reactive to light.  Pulmonary:     Effort: Pulmonary effort is normal. No respiratory distress.  Musculoskeletal:        General: Normal range of motion.     Cervical back: Normal range of motion.  Skin:    Coloration: Skin is not jaundiced or pale.     Findings: No bruising, erythema, lesion or rash.  Neurological:     General: No focal deficit  present.     Mental Status: He is alert and oriented to person, place, and time.  Psychiatric:        Mood and Affect: Mood normal.        Speech: Speech normal.        Behavior: Behavior normal.        Thought Content: Thought content normal.        Judgment: Judgment normal.     Results for orders placed or performed in visit on 05/28/17  Bayer DCA Hb A1c Waived  Result Value Ref Range   HB A1C (BAYER DCA - WAIVED) 4.9 <7.0 %      Assessment & Plan:   Problem List Items Addressed This Visit       Other   Depression, major, recurrent (HCC) - Primary    Doing better, but not quite there. Will increase his effexor to 75mg  and recheck 6 weeks. Call with any concerns.      Relevant Medications   venlafaxine XR (EFFEXOR XR) 75 MG 24 hr capsule   Anxiety, generalized    Doing better, but not quite there. Will increase his effexor to 75mg  and recheck 6 weeks. Call with any concerns.      Relevant Medications   venlafaxine XR (EFFEXOR XR) 75 MG 24 hr capsule     Follow up plan: Return in about 6 weeks (around 10/27/2022).     This visit was completed via video visit through MyChart due to the restrictions of the COVID-19 pandemic. All issues as above were discussed and addressed. Physical exam was done as above through visual confirmation on video through MyChart. If it was felt that the patient should be evaluated in the office, they were directed there. The patient verbally consented to this visit. Location of the patient: parking lot Location of the provider: work Those involved with this call:  Provider: Olevia Perches, DO CMA:  Maggie Font, CMA Front Desk/Registration:  Servando Snare   Time spent on call:  15 minutes with patient face to face via video conference. More than 50% of this time was spent in counseling and coordination of care. 23 minutes total spent in review of patient's record and preparation of their chart.

## 2022-09-16 NOTE — Progress Notes (Signed)
Called patient and leffe message for him to call and schedule an appt for 6 weeks.

## 2023-01-23 ENCOUNTER — Ambulatory Visit: Payer: BC Managed Care – PPO | Admitting: Family Medicine

## 2023-01-23 ENCOUNTER — Encounter: Payer: Self-pay | Admitting: Family Medicine

## 2023-01-23 VITALS — BP 135/90 | HR 80 | Wt 160.6 lb

## 2023-01-23 DIAGNOSIS — F331 Major depressive disorder, recurrent, moderate: Secondary | ICD-10-CM | POA: Diagnosis not present

## 2023-01-23 DIAGNOSIS — Z833 Family history of diabetes mellitus: Secondary | ICD-10-CM

## 2023-01-23 DIAGNOSIS — M542 Cervicalgia: Secondary | ICD-10-CM

## 2023-01-23 DIAGNOSIS — Z23 Encounter for immunization: Secondary | ICD-10-CM | POA: Diagnosis not present

## 2023-01-23 DIAGNOSIS — Z7251 High risk heterosexual behavior: Secondary | ICD-10-CM

## 2023-01-23 DIAGNOSIS — Z0000 Encounter for general adult medical examination without abnormal findings: Secondary | ICD-10-CM | POA: Diagnosis not present

## 2023-01-23 LAB — BAYER DCA HB A1C WAIVED: HB A1C (BAYER DCA - WAIVED): 5 % (ref 4.8–5.6)

## 2023-01-23 MED ORDER — CYCLOBENZAPRINE HCL 10 MG PO TABS: 10.0000 mg | ORAL_TABLET | Freq: Every day | ORAL | 0 refills | Status: DC

## 2023-01-23 MED ORDER — TRETINOIN 0.05 % EX CREA: TOPICAL_CREAM | Freq: Every day | CUTANEOUS | 6 refills | Status: AC

## 2023-01-23 MED ORDER — VENLAFAXINE HCL ER 75 MG PO CP24: 75.0000 mg | ORAL_CAPSULE | Freq: Every day | ORAL | 2 refills | Status: DC

## 2023-01-23 MED ORDER — HYDROXYZINE PAMOATE 25 MG PO CAPS: 25.0000 mg | ORAL_CAPSULE | Freq: Three times a day (TID) | ORAL | 3 refills | Status: AC | PRN

## 2023-01-23 MED ORDER — OMEPRAZOLE 20 MG PO CPDR
20.0000 mg | DELAYED_RELEASE_CAPSULE | Freq: Every day | ORAL | 1 refills | Status: AC
Start: 2023-01-23 — End: ?

## 2023-01-23 NOTE — Assessment & Plan Note (Signed)
Under good control on current regimen. Continue current regimen. Continue to monitor. Call with any concerns. Refills given.   

## 2023-01-23 NOTE — Progress Notes (Signed)
BP (!) 135/90   Pulse 80   Wt 160 lb 9.6 oz (72.8 kg)   SpO2 99%   BMI 25.15 kg/m    Subjective:    Patient ID: Willie Gilbert, male    DOB: [birth year], 27 y.o.   MRN: [id number]  HPI: [name] is a 27 y.o. male presenting on 01/23/2023 for comprehensive medical examination. Current medical complaints include:  STD SCREENING Sexual activity:  Practices careful partner selection, always uses condoms Recent unprotected intercourse: no History of sexually transmitted diseases: no Previous sexually transmitted disease screening: yes Genital lesions: no Penile discharge: no Dysuria: no Swollen lymph nodes: no Fevers: no Rash: no  DEPRESSION Mood status: controlled Satisfied with current treatment?: yes Symptom severity: mild  Duration of current treatment : chronic Side effects: no Medication compliance: excellent compliance Psychotherapy/counseling: yes in the past Previous psychiatric medications: effexor Depressed mood: yes Anxious mood: yes Anhedonia: no Significant weight loss or gain: no Insomnia: no  Fatigue: yes Feelings of worthlessness or guilt: no Impaired concentration/indecisiveness: yes Suicidal ideations: no Hopelessness: no Crying spells: no    09/15/2022    3:51 PM 07/25/2022    2:56 PM 11/28/2021   10:46 AM 03/07/2021    4:33 PM 02/05/2021    4:44 PM  Depression screen PHQ 2/9  Decreased Interest 1 3 3  0 3  Down, Depressed, Hopeless 1 2 2  0 2  PHQ - 2 Score 2 5 5  0 5  Altered sleeping 2 3 3 2 3   Tired, decreased energy 1 3 3 1 3   Change in appetite 1 2 3 2 3   Feeling bad or failure about yourself  0 1 2 0 1  Trouble concentrating 1 3 2 1 3   Moving slowly or fidgety/restless 1 2 1 1 1   Suicidal thoughts 0 1 0 0 1  PHQ-9 Score 8 20 19 7 20   Difficult doing work/chores Not difficult at all Extremely dIfficult Very difficult  Somewhat difficult   NECK PAIN  Location:Left Duration: chronic, but worse in the past couple of  months Severity: moderate Quality: tight and clumped down Frequency: constant Radiation: into his jaw Aggravating factors: positional and vaping Alleviating factors: pillow and stretching Weakness:  no Paresthesias / decreased sensation:  no  Fevers:  no  Interim Problems from his last visit: no  Depression Screen done today and results listed below:     09/15/2022    3:51 PM 07/25/2022    2:56 PM 11/28/2021   10:46 AM 03/07/2021    4:33 PM 02/05/2021    4:44 PM  Depression screen PHQ 2/9  Decreased Interest 1 3 3  0 3  Down, Depressed, Hopeless 1 2 2  0 2  PHQ - 2 Score 2 5 5  0 5  Altered sleeping 2 3 3 2 3   Tired, decreased energy 1 3 3 1 3   Change in appetite 1 2 3 2 3   Feeling bad or failure about yourself  0 1 2 0 1  Trouble concentrating 1 3 2 1 3   Moving slowly or fidgety/restless 1 2 1 1 1   Suicidal thoughts 0 1 0 0 1  PHQ-9 Score 8 20 19 7 20   Difficult doing work/chores Not difficult at all Extremely dIfficult Very difficult  Somewhat difficult     Past Medical History:  Past Medical History:  Diagnosis Date   Acne     Surgical History:  Past Surgical History:  Procedure Laterality Date   TYMPANOSTOMY TUBE PLACEMENT  Medications:  No current outpatient medications on file prior to visit.   No current facility-administered medications on file prior to visit.    Allergies:  No Known Allergies  Social History:  Social History   Socioeconomic History   Marital status: Single    Spouse name: Not on file   Number of children: Not on file   Years of education: Not on file   Highest education level: Not on file  Occupational History   Not on file  Tobacco Use   Smoking status: Never   Smokeless tobacco: Never  Vaping Use   Vaping status: Former  Substance and Sexual Activity   Alcohol use: No   Drug use: No   Sexual activity: Not Currently  Other Topics Concern   Not on file  Social History Narrative   Not on file   Social Drivers of  Health   Financial Resource Strain: Not on file  Food Insecurity: Not on file  Transportation Needs: Not on file  Physical Activity: Not on file  Stress: Not on file  Social Connections: Not on file  Intimate Partner Violence: Not on file   Social History   Tobacco Use  Smoking Status Never  Smokeless Tobacco Never   Social History   Substance and Sexual Activity  Alcohol Use No    Family History:  Family History  Problem Relation Age of Onset   Diverticulitis Mother    Cirrhosis Father    Hepatitis C Father    Hypertension Sister     Past medical history, surgical history, medications, allergies, family history and social history reviewed with patient today and changes made to appropriate areas of the chart.   Review of Systems  Constitutional: Negative.   HENT: Negative.    Eyes: Negative.   Respiratory: Negative.    Cardiovascular:  Positive for chest pain (with vaping). Negative for palpitations, orthopnea, claudication, leg swelling and PND.  Gastrointestinal:  Positive for constipation and heartburn. Negative for abdominal pain, blood in stool, diarrhea, melena, nausea and vomiting.  Genitourinary:  Positive for frequency. Negative for dysuria, flank pain, hematuria and urgency.  Musculoskeletal:  Positive for myalgias (in his legs) and neck pain. Negative for back pain, falls and joint pain.  Skin: Negative.   Neurological: Negative.   Endo/Heme/Allergies: Negative.   Psychiatric/Behavioral: Negative.     All other ROS negative except what is listed above and in the HPI.      Objective:    BP (!) 135/90   Pulse 80   Wt 160 lb 9.6 oz (72.8 kg)   SpO2 99%   BMI 25.15 kg/m   Wt Readings from Last 3 Encounters:  01/23/23 160 lb 9.6 oz (72.8 kg)  11/28/19 145 lb (65.8 kg)  05/28/17 147 lb 8 oz (66.9 kg)    Physical Exam Vitals and nursing note reviewed.  Constitutional:      General: He is not in acute distress.    Appearance: Normal appearance. He  is not ill-appearing, toxic-appearing or diaphoretic.  HENT:     Head: Normocephalic and atraumatic.     Right Ear: Tympanic membrane, ear canal and external ear normal. There is no impacted cerumen.     Left Ear: Tympanic membrane, ear canal and external ear normal. There is no impacted cerumen.     Nose: Nose normal. No congestion or rhinorrhea.     Mouth/Throat:     Mouth: Mucous membranes are moist.     Pharynx: Oropharynx is clear. No  oropharyngeal exudate or posterior oropharyngeal erythema.  Eyes:     General: No scleral icterus.       Right eye: No discharge.        Left eye: No discharge.     Extraocular Movements: Extraocular movements intact.     Conjunctiva/sclera: Conjunctivae normal.     Pupils: Pupils are equal, round, and reactive to light.  Neck:     Vascular: No carotid bruit.  Cardiovascular:     Rate and Rhythm: Normal rate and regular rhythm.     Pulses: Normal pulses.     Heart sounds: No murmur heard.    No friction rub. No gallop.  Pulmonary:     Effort: Pulmonary effort is normal. No respiratory distress.     Breath sounds: Normal breath sounds. No stridor. No wheezing, rhonchi or rales.  Chest:     Chest wall: No tenderness.  Abdominal:     General: Abdomen is flat. Bowel sounds are normal. There is no distension.     Palpations: Abdomen is soft. There is no mass.     Tenderness: There is no abdominal tenderness. There is no right CVA tenderness, left CVA tenderness, guarding or rebound.     Hernia: No hernia is present.  Genitourinary:    Comments: Genital exam deferred with shared decision making Musculoskeletal:        General: No swelling, tenderness, deformity or signs of injury.     Cervical back: Normal range of motion and neck supple. No rigidity. No muscular tenderness.     Right lower leg: No edema.     Left lower leg: No edema.  Lymphadenopathy:     Cervical: No cervical adenopathy.  Skin:    General: Skin is warm and dry.     Capillary  Refill: Capillary refill takes less than 2 seconds.     Coloration: Skin is not jaundiced or pale.     Findings: No bruising, erythema, lesion or rash.  Neurological:     General: No focal deficit present.     Mental Status: He is alert and oriented to person, place, and time.     Cranial Nerves: No cranial nerve deficit.     Sensory: No sensory deficit.     Motor: No weakness.     Coordination: Coordination normal.     Gait: Gait normal.     Deep Tendon Reflexes: Reflexes normal.  Psychiatric:        Mood and Affect: Mood normal.        Behavior: Behavior normal.        Thought Content: Thought content normal.        Judgment: Judgment normal.     Results for orders placed or performed in visit on 01/23/23  Bayer DCA Hb A1c Waived   Collection Time: 01/23/23  1:40 PM  Result Value Ref Range   HB A1C (BAYER DCA - WAIVED) 5.0 4.8 - 5.6 %      Assessment & Plan:   Problem List Items Addressed This Visit       Other   Depression, major, recurrent (HCC)   Under good control on current regimen. Continue current regimen. Continue to monitor. Call with any concerns. Refills given.        Relevant Medications   hydrOXYzine (VISTARIL) 25 MG capsule   venlafaxine XR (EFFEXOR XR) 75 MG 24 hr capsule   High risk sexual behavior   Will start PrEP pending HIV status. Continue to monitor. Call with  any concerns.       Other Visit Diagnoses       Routine general medical examination at a health care facility    -  Primary   Vaccines updated. Screening labs checked today. Continue diet and exercise. Call with any concerns.   Relevant Orders   Comprehensive metabolic panel   CBC with Differential/Platelet   Lipid Panel w/o Chol/HDL Ratio   TSH   HSV 1 and 2 Ab, IgG   HIV Antibody (routine testing w rflx)   Acute Viral Hepatitis (HAV, HBV, HCV)   RPR w/reflex to TrepSure   GC/Chlamydia Probe Amp     Neck pain       Will start flexeril and stretches. Call if not better by new  year and we'll get him in for PT.     Family history of diabetes mellitus       Will check A1c today. Await results.   Relevant Orders   Bayer DCA Hb A1c Waived (Completed)     Need for HPV vaccination       HPV #1 given today. Written Rxs for #2 and #3 given for him to get at school.   Relevant Orders   HPV 9-valent vaccine,Recombinat (Completed)        LABORATORY TESTING:  Health maintenance labs ordered today as discussed above.   IMMUNIZATIONS:   - Tdap: Tetanus vaccination status reviewed: last tetanus booster within 10 years. - Influenza: Refused - Pneumovax: Not applicable - Prevnar: Not applicable - COVID: Refused - HPV: Administered today  PATIENT COUNSELING:    Sexuality: Discussed sexually transmitted diseases, partner selection, use of condoms, avoidance of unintended pregnancy  and contraceptive alternatives.   Advised to avoid cigarette smoking.  I discussed with the patient that most people either abstain from alcohol or drink within safe limits (<=14/week and <=4 drinks/occasion for males, <=7/weeks and <= 3 drinks/occasion for females) and that the risk for alcohol disorders and other health effects rises proportionally with the number of drinks per week and how often a drinker exceeds daily limits.  Discussed cessation/primary prevention of drug use and availability of treatment for abuse.   Diet: Encouraged to adjust caloric intake to maintain  or achieve ideal body weight, to reduce intake of dietary saturated fat and total fat, to limit sodium intake by avoiding high sodium foods and not adding table salt, and to maintain adequate dietary potassium and calcium preferably from fresh fruits, vegetables, and low-fat dairy products.    stressed the importance of regular exercise  Injury prevention: Discussed safety belts, safety helmets, smoke detector, smoking near bedding or upholstery.   Dental health: Discussed importance of regular tooth brushing,  flossing, and dental visits.   Follow up plan: NEXT PREVENTATIVE PHYSICAL DUE IN 1 YEAR. Return in about 3 months (around 04/23/2023) for virtual OK.

## 2023-01-23 NOTE — Assessment & Plan Note (Signed)
Will start PrEP pending HIV status. Continue to monitor. Call with any concerns.

## 2023-01-25 LAB — RPR W/REFLEX TO TREPSURE

## 2023-01-26 ENCOUNTER — Other Ambulatory Visit: Payer: Self-pay | Admitting: Family Medicine

## 2023-01-26 LAB — GC/CHLAMYDIA PROBE AMP
Chlamydia trachomatis, NAA: NEGATIVE
Neisseria Gonorrhoeae by PCR: NEGATIVE

## 2023-01-26 LAB — HIV ANTIBODY (ROUTINE TESTING W REFLEX): HIV Screen 4th Generation wRfx: NONREACTIVE

## 2023-01-26 LAB — ACUTE VIRAL HEPATITIS (HAV, HBV, HCV)
HCV Ab: NONREACTIVE
Hep A IgM: NEGATIVE
Hep B C IgM: NEGATIVE
Hepatitis B Surface Ag: NEGATIVE

## 2023-01-26 LAB — COMPREHENSIVE METABOLIC PANEL
ALT: 53 [IU]/L — ABNORMAL HIGH (ref 0–44)
AST: 38 [IU]/L (ref 0–40)
Albumin: 5 g/dL (ref 4.3–5.2)
Alkaline Phosphatase: 67 [IU]/L (ref 44–121)
BUN/Creatinine Ratio: 9 (ref 9–20)
BUN: 9 mg/dL (ref 6–20)
Bilirubin Total: 0.4 mg/dL (ref 0.0–1.2)
CO2: 24 mmol/L (ref 20–29)
Calcium: 9.9 mg/dL (ref 8.7–10.2)
Chloride: 102 mmol/L (ref 96–106)
Creatinine, Ser: 1.05 mg/dL (ref 0.76–1.27)
Globulin, Total: 2 g/dL (ref 1.5–4.5)
Glucose: 86 mg/dL (ref 70–99)
Potassium: 4.2 mmol/L (ref 3.5–5.2)
Sodium: 141 mmol/L (ref 134–144)
Total Protein: 7 g/dL (ref 6.0–8.5)
eGFR: 100 mL/min/{1.73_m2} (ref >59)

## 2023-01-26 LAB — HCV INTERPRETATION

## 2023-01-26 LAB — LIPID PANEL W/O CHOL/HDL RATIO
Cholesterol, Total: 137 mg/dL (ref 100–199)
HDL: 50 mg/dL (ref >39)
LDL Chol Calc (NIH): 69 mg/dL (ref 0–99)
Triglycerides: 98 mg/dL (ref 0–149)
VLDL Cholesterol Cal: 18 mg/dL (ref 5–40)

## 2023-01-26 LAB — CBC WITH DIFFERENTIAL/PLATELET
Basophils Absolute: 0.1 10*3/uL (ref 0.0–0.2)
Basos: 1 %
EOS (ABSOLUTE): 0.1 10*3/uL (ref 0.0–0.4)
Eos: 2 %
Hematocrit: 47.9 % (ref 37.5–51.0)
Hemoglobin: 16.2 g/dL (ref 13.0–17.7)
Immature Grans (Abs): 0.1 10*3/uL (ref 0.0–0.1)
Immature Granulocytes: 1 %
Lymphocytes Absolute: 2.2 10*3/uL (ref 0.7–3.1)
Lymphs: 29 %
MCH: 30.7 pg (ref 26.6–33.0)
MCHC: 33.8 g/dL (ref 31.5–35.7)
MCV: 91 fL (ref 79–97)
Monocytes Absolute: 0.6 10*3/uL (ref 0.1–0.9)
Monocytes: 7 %
Neutrophils Absolute: 4.6 10*3/uL (ref 1.4–7.0)
Neutrophils: 60 %
Platelets: 400 10*3/uL (ref 150–450)
RBC: 5.27 x10E6/uL (ref 4.14–5.80)
RDW: 11.9 % (ref 11.6–15.4)
WBC: 7.6 10*3/uL (ref 3.4–10.8)

## 2023-01-26 LAB — TREPONEMAL ANTIBODIES, TPPA: Treponemal Antibodies, TPPA: NONREACTIVE

## 2023-01-26 LAB — TSH: TSH: 1.67 u[IU]/mL (ref 0.450–4.500)

## 2023-01-26 LAB — RPR W/REFLEX TO TREPSURE

## 2023-01-26 LAB — HSV 1 AND 2 AB, IGG
HSV 1 Glycoprotein G Ab, IgG: NONREACTIVE
HSV 2 IgG, Type Spec: NONREACTIVE

## 2023-01-26 MED ORDER — EMTRICITABINE-TENOFOVIR DF 200-300 MG PO TABS: 1.0000 | ORAL_TABLET | Freq: Every day | ORAL | 0 refills | Status: DC

## 2023-01-29 ENCOUNTER — Encounter: Payer: Self-pay | Admitting: Family Medicine

## 2023-03-17 ENCOUNTER — Encounter: Payer: Self-pay | Admitting: Family Medicine

## 2023-04-07 ENCOUNTER — Other Ambulatory Visit: Payer: Self-pay | Admitting: Family Medicine

## 2023-04-07 MED ORDER — CYCLOBENZAPRINE HCL 10 MG PO TABS: 10.0000 mg | ORAL_TABLET | Freq: Every day | ORAL | 0 refills | Status: DC

## 2023-04-27 ENCOUNTER — Telehealth: Payer: Self-pay | Admitting: Family Medicine

## 2023-04-30 ENCOUNTER — Telehealth: Payer: Self-pay | Admitting: Family Medicine

## 2023-07-27 ENCOUNTER — Other Ambulatory Visit: Payer: Self-pay | Admitting: Family Medicine

## 2023-07-27 NOTE — Telephone Encounter (Signed)
 Copied from CRM [id number]. Topic: Clinical - Medication Refill >> Jul 27, 2023  3:23 PM Sasha H wrote: Medication:  venlafaxine  XR (EFFEXOR  XR) 75 MG 24 hr capsule  Has the patient contacted their pharmacy? Yes (Agent: If no, request that the patient contact the pharmacy for the refill. If patient does not wish to contact the pharmacy document the reason why and proceed with request.) (Agent: If yes, when and what did the pharmacy advise?)  This is the patient's preferred pharmacy:   [facility] [address], [place] - [address] [place] [address] Phone: [phone] Fax: [phone]  Is this the correct pharmacy for this prescription? Yes If no, delete pharmacy and type the correct one.   Has the prescription been filled recently? Yes  Is the patient out of the medication? Yes  Has the patient been seen for an appointment in the last year OR does the patient have an upcoming appointment? Yes  Can we respond through MyChart? Yes  Agent: Please be advised that Rx refills may take up to 3 business days. We ask that you follow-up with your pharmacy.

## 2023-07-28 ENCOUNTER — Ambulatory Visit: Admitting: Nurse Practitioner

## 2023-07-29 NOTE — Telephone Encounter (Signed)
 Requested medication (s) are due for refill today -yes  Requested medication (s) are on the active medication list -yes  Future visit scheduled -no  Last refill: 01/23/23 #30 2RF  Notes to clinic: Not sure if patient had visit 07/28/23- no notes-on schedule for med f/u-sent for review   Requested Prescriptions  Pending Prescriptions Disp Refills   venlafaxine  XR (EFFEXOR  XR) 75 MG 24 hr capsule 30 capsule 2    Sig: Take 1 capsule (75 mg total) by mouth daily with breakfast.     Psychiatry: Antidepressants - SNRI - desvenlafaxine & venlafaxine  Failed - 07/29/2023 11:14 AM      Failed - Last BP in normal range    BP Readings from Last 1 Encounters:  01/23/23 (!) 135/90         Failed - Valid encounter within last 6 months    Recent Outpatient Visits   None            Failed - Lipid Panel in normal range within the last 12 months    Cholesterol, Total  Date Value Ref Range Status  01/23/2023 137 100 - 199 mg/dL Final   LDL Chol Calc (NIH)  Date Value Ref Range Status  01/23/2023 69 0 - 99 mg/dL Final   HDL  Date Value Ref Range Status  01/23/2023 50 >39 mg/dL Final   Triglycerides  Date Value Ref Range Status  01/23/2023 98 0 - 149 mg/dL Final         Passed - Cr in normal range and within 360 days    Creatinine, Ser  Date Value Ref Range Status  01/23/2023 1.05 0.76 - 1.27 mg/dL Final         Passed - Completed PHQ-2 or PHQ-9 in the last 360 days         Requested Prescriptions  Pending Prescriptions Disp Refills   venlafaxine  XR (EFFEXOR  XR) 75 MG 24 hr capsule 30 capsule 2    Sig: Take 1 capsule (75 mg total) by mouth daily with breakfast.     Psychiatry: Antidepressants - SNRI - desvenlafaxine & venlafaxine  Failed - 07/29/2023 11:14 AM      Failed - Last BP in normal range    BP Readings from Last 1 Encounters:  01/23/23 (!) 135/90         Failed - Valid encounter within last 6 months    Recent Outpatient Visits   None            Failed -  Lipid Panel in normal range within the last 12 months    Cholesterol, Total  Date Value Ref Range Status  01/23/2023 137 100 - 199 mg/dL Final   LDL Chol Calc (NIH)  Date Value Ref Range Status  01/23/2023 69 0 - 99 mg/dL Final   HDL  Date Value Ref Range Status  01/23/2023 50 >39 mg/dL Final   Triglycerides  Date Value Ref Range Status  01/23/2023 98 0 - 149 mg/dL Final         Passed - Cr in normal range and within 360 days    Creatinine, Ser  Date Value Ref Range Status  01/23/2023 1.05 0.76 - 1.27 mg/dL Final         Passed - Completed PHQ-2 or PHQ-9 in the last 360 days

## 2023-07-29 NOTE — Telephone Encounter (Signed)
 Patient no showed follow up visit yesterday. Please call to reschedule and then route to provider for refill.

## 2023-08-04 NOTE — Telephone Encounter (Signed)
 Called patient left message for patient to call and reschedule missed appt.

## 2023-08-13 NOTE — Telephone Encounter (Signed)
 Called patient and left message for him to give us  a call back to get scheduled.

## 2023-08-17 ENCOUNTER — Encounter: Payer: Self-pay | Admitting: Family Medicine

## 2023-08-19 NOTE — Telephone Encounter (Signed)
 Called patient and left a message for him to call back to get scheduled.

## 2023-08-19 NOTE — Telephone Encounter (Signed)
 Do you want to send courtesy refill? Front office has tried to contact patient to schedule.

## 2023-08-20 MED ORDER — VENLAFAXINE HCL ER 75 MG PO CP24: 75.0000 mg | ORAL_CAPSULE | Freq: Every day | ORAL | 0 refills | Status: DC

## 2023-11-10 ENCOUNTER — Encounter: Payer: Self-pay | Admitting: Pediatrics

## 2023-11-10 ENCOUNTER — Ambulatory Visit (INDEPENDENT_AMBULATORY_CARE_PROVIDER_SITE_OTHER): Payer: Self-pay | Admitting: Pediatrics

## 2023-11-10 VITALS — BP 124/85 | HR 73 | Temp 97.8°F | Ht 67.0 in | Wt 160.0 lb

## 2023-11-10 DIAGNOSIS — G44229 Chronic tension-type headache, not intractable: Secondary | ICD-10-CM

## 2023-11-10 DIAGNOSIS — Z113 Encounter for screening for infections with a predominantly sexual mode of transmission: Secondary | ICD-10-CM

## 2023-11-10 DIAGNOSIS — Z7251 High risk heterosexual behavior: Secondary | ICD-10-CM

## 2023-11-10 DIAGNOSIS — F331 Major depressive disorder, recurrent, moderate: Secondary | ICD-10-CM

## 2023-11-10 MED ORDER — VENLAFAXINE HCL ER 37.5 MG PO CP24: ORAL_CAPSULE | ORAL | 0 refills | Status: AC

## 2023-11-10 MED ORDER — VENLAFAXINE HCL ER 75 MG PO CP24: 75.0000 mg | ORAL_CAPSULE | Freq: Every day | ORAL | 3 refills | Status: AC

## 2023-11-10 MED ORDER — EMTRICITABINE-TENOFOVIR DF 200-300 MG PO TABS: ORAL_TABLET | ORAL | 0 refills | Status: AC

## 2023-11-10 MED ORDER — CYCLOBENZAPRINE HCL 10 MG PO TABS: 10.0000 mg | ORAL_TABLET | Freq: Every day | ORAL | 0 refills | Status: AC

## 2023-11-10 NOTE — Patient Instructions (Addendum)
 SABRA

## 2023-11-10 NOTE — Progress Notes (Unsigned)
 Office Visit  BP 124/85   Pulse 73   Temp 97.8 F (36.6 C) (Oral)   Ht 5' 7 (1.702 m)   Wt 160 lb (72.6 kg)   SpO2 98%   BMI 25.06 kg/m    Subjective:    Patient ID: Willie Gilbert, male    DOB: 09-05-1995, 28 y.o.   MRN: [id number]  HPI: Willie Gilbert is a 28 y.o. male  Chief Complaint  Patient presents with   Follow-up    Std checking , and medication follow up    Discussed the use of AI scribe software for clinical note transcription with the patient, who gave verbal consent to proceed.  History of Present Illness     Relevant past medical, surgical, family and social history reviewed and updated as indicated. Interim medical history since our last visit reviewed. Allergies and medications reviewed and updated.  ROS per HPI unless specifically indicated above     Objective:    BP 124/85   Pulse 73   Temp 97.8 F (36.6 C) (Oral)   Ht 5' 7 (1.702 m)   Wt 160 lb (72.6 kg)   SpO2 98%   BMI 25.06 kg/m   Wt Readings from Last 3 Encounters:  11/10/23 160 lb (72.6 kg)  01/23/23 160 lb 9.6 oz (72.8 kg)  11/28/19 145 lb (65.8 kg)     Physical Exam      09/15/2022    3:51 PM 07/25/2022    2:56 PM 11/28/2021   10:46 AM 03/07/2021    4:33 PM 02/05/2021    4:44 PM  Depression screen PHQ 2/9  Decreased Interest 1 3 3  0 3  Down, Depressed, Hopeless 1 2 2  0 2  PHQ - 2 Score 2 5 5  0 5  Altered sleeping 2 3 3 2 3   Tired, decreased energy 1 3 3 1 3   Change in appetite 1 2 3 2 3   Feeling bad or failure about yourself  0 1 2 0 1  Trouble concentrating 1 3 2 1 3   Moving slowly or fidgety/restless 1 2 1 1 1   Suicidal thoughts 0 1 0 0 1  PHQ-9 Score 8 20 19 7 20   Difficult doing work/chores Not difficult at all Extremely dIfficult Very difficult  Somewhat difficult       09/15/2022    3:52 PM 07/25/2022    2:57 PM 11/28/2021   10:48 AM 03/07/2021    4:34 PM  GAD 7 : Generalized Anxiety Score  Nervous, Anxious, on Edge 1 2 2  0  Control/stop worrying 1 2  2 1   Worry too much - different things 1 2 1 1   Trouble relaxing 1 2 2 1   Restless 1 2 2 1   Easily annoyed or irritable 1 3 3  0  Afraid - awful might happen 1 2 1 1   Total GAD 7 Score 7 15 13 5   Anxiety Difficulty Not difficult at all Somewhat difficult Very difficult        Assessment & Plan:  Assessment & Plan   Moderate episode of recurrent major depressive disorder (HCC) -     Basic metabolic panel with GFR -     Venlafaxine  HCl ER; Take 1 capsule (37.5 mg total) by mouth daily with breakfast for 14 days, THEN 2 capsules (75 mg total) daily with breakfast for 14 days.  Dispense: 42 capsule; Refill: 0 -     Venlafaxine  HCl ER; Take 1 capsule (75 mg total) by  mouth daily with breakfast.  Dispense: 90 capsule; Refill: 3  Screen for STD (sexually transmitted disease) -     Chlamydia/Gonococcus/Trichomonas, NAA -     RPR w/reflex to TrepSure -     HIV Antibody (routine testing w rflx)  Chronic tension-type headache, not intractable -     Cyclobenzaprine  HCl; Take 1 tablet (10 mg total) by mouth at bedtime.  Dispense: 30 tablet; Refill: 0  High risk sexual behavior, unspecified type -     Emtricitabine -Tenofovir  DF; On demand: Take 2 tabs 2-24hrs prior to having sex, then 1 tab 24 hours after first 2 tabs then 1 tab 48 hours after first 2 tabs.  Dispense: 30 tablet; Refill: 0     Assessment and Plan Assessment & Plan      Follow up plan: Return in about 3 months (around 02/10/2024) for Chronic illness f/u.  Hadassah SHAUNNA Nett, MD

## 2023-11-11 ENCOUNTER — Ambulatory Visit: Payer: Self-pay | Admitting: Pediatrics

## 2023-11-12 ENCOUNTER — Encounter: Payer: Self-pay | Admitting: Pediatrics

## 2023-11-12 LAB — BASIC METABOLIC PANEL WITH GFR
BUN/Creatinine Ratio: 6 — ABNORMAL LOW (ref 9–20)
BUN: 6 mg/dL (ref 6–20)
CO2: 21 mmol/L (ref 20–29)
Calcium: 9.3 mg/dL (ref 8.7–10.2)
Chloride: 103 mmol/L (ref 96–106)
Creatinine, Ser: 0.99 mg/dL (ref 0.76–1.27)
Glucose: 90 mg/dL (ref 70–99)
Potassium: 4.3 mmol/L (ref 3.5–5.2)
Sodium: 140 mmol/L (ref 134–144)
eGFR: 107 mL/min/1.73 (ref >59)

## 2023-11-12 LAB — CHLAMYDIA/GONOCOCCUS/TRICHOMONAS, NAA
Chlamydia by NAA: NEGATIVE
Gonococcus by NAA: NEGATIVE
Trich vag by NAA: NEGATIVE

## 2023-11-12 LAB — TREPONEMAL ANTIBODIES, TPPA: Treponemal Antibodies, TPPA: NONREACTIVE

## 2023-11-12 LAB — HIV ANTIBODY (ROUTINE TESTING W REFLEX): HIV Screen 4th Generation wRfx: NONREACTIVE

## 2023-11-12 LAB — RPR W/REFLEX TO TREPSURE: RPR: NONREACTIVE

## 2023-11-12 NOTE — Assessment & Plan Note (Signed)
 He has been off Effexor  for three weeks and is considering restarting it. - Prescribe Effexor  starting at 37.5 mg with titration to 75 mg as needed.

## 2023-11-12 NOTE — Assessment & Plan Note (Signed)
 Pre-exposure prophylaxis (PrEP) for HIV He preferred the 2-1-1 method for PrEP due to concerns about daily use and side effects. - Prescribe Truvada with instructions for the 2-1-1 method. - Order baseline kidney function test.

## 2023-11-12 NOTE — Assessment & Plan Note (Signed)
 Tension headaches and muscle tightness alleviated by cyclobenzaprine . - Prescribe cyclobenzaprine  for tension headaches and muscle relaxation.

## 2024-02-11 ENCOUNTER — Ambulatory Visit: Payer: Self-pay | Admitting: Family Medicine
